# Patient Record
Sex: Female | Born: 1960 | Race: White | Hispanic: No | Smoking: Current every day smoker
Health system: Southern US, Community
[De-identification: ages and names within clinical notes are randomized; demographics above are authoritative.]

## PROBLEM LIST (undated history)

## (undated) DIAGNOSIS — F329 Major depressive disorder, single episode, unspecified: Secondary | ICD-10-CM

## (undated) DIAGNOSIS — F319 Bipolar disorder, unspecified: Secondary | ICD-10-CM

## (undated) DIAGNOSIS — G4700 Insomnia, unspecified: Secondary | ICD-10-CM

## (undated) DIAGNOSIS — F32A Depression, unspecified: Secondary | ICD-10-CM

## (undated) HISTORY — PX: EYE SURGERY: SHX253

---

## 1999-11-06 ENCOUNTER — Encounter: Payer: Self-pay | Admitting: Emergency Medicine

## 1999-11-06 ENCOUNTER — Emergency Department (HOSPITAL_COMMUNITY): Admission: EM | Admit: 1999-11-06 | Discharge: 1999-11-06 | Payer: Self-pay | Admitting: Emergency Medicine

## 2001-06-29 ENCOUNTER — Emergency Department (HOSPITAL_COMMUNITY): Admission: EM | Admit: 2001-06-29 | Discharge: 2001-06-29 | Payer: Self-pay

## 2002-05-05 ENCOUNTER — Emergency Department (HOSPITAL_COMMUNITY): Admission: EM | Admit: 2002-05-05 | Discharge: 2002-05-05 | Payer: Self-pay | Admitting: Emergency Medicine

## 2005-07-27 ENCOUNTER — Emergency Department (HOSPITAL_COMMUNITY): Admission: EM | Admit: 2005-07-27 | Discharge: 2005-07-27 | Payer: Self-pay | Admitting: Family Medicine

## 2009-08-08 ENCOUNTER — Emergency Department (HOSPITAL_COMMUNITY): Admission: EM | Admit: 2009-08-08 | Discharge: 2009-08-08 | Payer: Self-pay | Admitting: Family Medicine

## 2013-12-26 ENCOUNTER — Ambulatory Visit: Payer: No Typology Code available for payment source | Attending: Internal Medicine

## 2014-01-10 ENCOUNTER — Encounter (HOSPITAL_COMMUNITY): Payer: Self-pay | Admitting: Emergency Medicine

## 2014-01-10 ENCOUNTER — Emergency Department (HOSPITAL_COMMUNITY)
Admission: EM | Admit: 2014-01-10 | Discharge: 2014-01-10 | Disposition: A | Discharge status: Home / Self Care | Payer: No Typology Code available for payment source | Attending: Emergency Medicine | Admitting: Emergency Medicine

## 2014-01-10 DIAGNOSIS — F17200 Nicotine dependence, unspecified, uncomplicated: Secondary | ICD-10-CM | POA: Insufficient documentation

## 2014-01-10 DIAGNOSIS — R52 Pain, unspecified: Secondary | ICD-10-CM | POA: Insufficient documentation

## 2014-01-10 DIAGNOSIS — F411 Generalized anxiety disorder: Secondary | ICD-10-CM | POA: Insufficient documentation

## 2014-01-10 DIAGNOSIS — R519 Headache, unspecified: Secondary | ICD-10-CM

## 2014-01-10 DIAGNOSIS — Z79899 Other long term (current) drug therapy: Secondary | ICD-10-CM | POA: Insufficient documentation

## 2014-01-10 DIAGNOSIS — R51 Headache: Secondary | ICD-10-CM | POA: Insufficient documentation

## 2014-01-10 LAB — I-STAT CHEM 8, ED
BUN: 17 mg/dL (ref 6–23)
Calcium, Ion: 1.2 mmol/L (ref 1.12–1.23)
Chloride: 105 mEq/L (ref 96–112)
Creatinine, Ser: 0.7 mg/dL (ref 0.50–1.10)
Glucose, Bld: 89 mg/dL (ref 70–99)
HCT: 46 % (ref 36.0–46.0)
Hemoglobin: 15.6 g/dL — ABNORMAL HIGH (ref 12.0–15.0)
Potassium: 4.8 mEq/L (ref 3.7–5.3)
Sodium: 140 mEq/L (ref 137–147)
TCO2: 26 mmol/L (ref 0–100)

## 2014-01-10 LAB — I-STAT CG4 LACTIC ACID, ED: Lactic Acid, Venous: 1.03 mmol/L (ref 0.5–2.2)

## 2014-01-10 MED ORDER — KETOROLAC TROMETHAMINE 30 MG/ML IJ SOLN
30.0000 mg | Freq: Once | INTRAMUSCULAR | Status: AC
  Administered 2014-01-10: 30 mg via INTRAVENOUS
  Filled 2014-01-10: qty 1

## 2014-01-10 MED ORDER — HALOPERIDOL LACTATE 5 MG/ML IJ SOLN
2.0000 mg | Freq: Once | INTRAMUSCULAR | Status: AC
  Administered 2014-01-10: 2 mg via INTRAVENOUS
  Filled 2014-01-10: qty 1

## 2014-01-10 MED ORDER — SODIUM CHLORIDE 0.9 % IV BOLUS (SEPSIS)
1000.0000 mL | Freq: Once | INTRAVENOUS | Status: AC
  Administered 2014-01-10: 1000 mL via INTRAVENOUS

## 2014-01-10 MED ORDER — DIPHENHYDRAMINE HCL 50 MG/ML IJ SOLN
25.0000 mg | Freq: Once | INTRAMUSCULAR | Status: AC
  Administered 2014-01-10: 25 mg via INTRAVENOUS
  Filled 2014-01-10: qty 1

## 2014-01-10 NOTE — ED Notes (Signed)
Discontinued pts iv. Pt is getting dressed waiting at bedside for discharge paperwork from nurse.

## 2014-01-10 NOTE — ED Notes (Addendum)
Per pt sts she has had a severe HA for 3 days. sts aching all over and vision problems and vomiting up scant amount of blood. sts she is under a lot of stress.

## 2014-01-10 NOTE — ED Notes (Signed)
Pt on monitor, Pt monitored by pulse ox, blood pressure and 5 lead.

## 2014-01-10 NOTE — ED Provider Notes (Signed)
CSN: [id number]     Arrival date & time 01/10/14  1652 History   First MD Initiated Contact with Patient 01/10/14 1751     Chief Complaint  Patient presents with  . Migraine  . Generalized Body Aches     (Consider location/radiation/quality/duration/timing/severity/associated sxs/prior Treatment) Patient is a 53 y.o. female presenting with headaches. The history is provided by the patient.  Headache Pain location:  Generalized Quality:  Dull Radiates to:  Does not radiate Onset quality:  Gradual Duration:  3 days Timing:  Constant Progression:  Worsening Chronicity:  New Similar to prior headaches: yes   Context: activity, bright light, loud noise and straining   Relieved by:  Nothing Worsened by:  Nothing tried Ineffective treatments:  None tried Associated symptoms: myalgias   Associated symptoms: no abdominal pain, no cough, no fever and no weakness   Associated symptoms comment:  Body aches all over for the last several months.   History reviewed. No pertinent past medical history. Past Surgical History  Procedure Laterality Date  . Eye surgery     History reviewed. No pertinent family history. History  Substance Use Topics  . Smoking status: Current Every Day Smoker  . Smokeless tobacco: Not on file  . Alcohol Use: No   OB History   Grav Para Term Preterm Abortions TAB SAB Ect Mult Living                 Review of Systems  Constitutional: Negative for fever.  Respiratory: Negative for cough.   Gastrointestinal: Negative for abdominal pain.  Musculoskeletal: Positive for myalgias.  Neurological: Positive for headaches.  All other systems reviewed and are negative.     Allergies  Review of patient's allergies indicates no known allergies.  Home Medications   Prior to Admission medications   Medication Sig Start Date End Date Taking? Authorizing Provider  ALPRAZolam Prudy Feeler) 0.5 MG tablet Take 0.5 mg by mouth daily as needed for anxiety.   Yes  Historical Provider, MD  esomeprazole (NEXIUM) 40 MG capsule Take 40 mg by mouth daily at 12 noon.   Yes Historical Provider, MD   BP 120/63  Pulse 67  Temp(Src) 97.5 F (36.4 C) (Oral)  Resp 11  SpO2 99% Physical Exam  Constitutional: She is oriented to person, place, and time. She appears well-developed and well-nourished. No distress.  HENT:  Head: Normocephalic.  Eyes: Conjunctivae are normal.  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 No tracheal deviation present.  Cardiovascular: Normal rate, regular rhythm and normal heart sounds.   Pulmonary/Chest: Effort normal. No respiratory distress. She has no wheezes.  Abdominal: Soft. She exhibits no distension.  Neurological: She is alert and oriented to person, place, and time. No cranial nerve deficit. Coordination normal. GCS eye subscore is 4. GCS verbal subscore is 5. GCS motor subscore is 6.  Skin: Skin is warm and dry.  Psychiatric: Her mood appears anxious. Her speech is rapid and/or pressured.    ED Course  Procedures (including critical care time) Labs Review Labs Reviewed  I-STAT CHEM 8, ED - Abnormal; Notable for the following:    Hemoglobin 15.6 (*)    All other components within normal limits  I-STAT CG4 LACTIC ACID, ED    Imaging Review No results found.   EKG Interpretation None      MDM   Final diagnoses:  Headache  Body aches    53 y.o. female presents with headache over the  last 3 days and body aches that she says have been affecting her regarding her body over the last several weeks and she has been seeking disability for. She is still working on getting in to her primary care clinic and has been him and unable to see him. She has no focal neurologic findings on her exam and she speaking in full sentences but extremely anxious. She was given a modified migraine cocktail with Haldol, Benadryl, IV fluid, and Toradol with good relief of symptoms. She was  ambulatory without assistance of the emergency department and had no further headache symptoms. No indication for acute imaging, screening labs were normal, patient was recommended to followup with the community health on the center as previously recommended and she states she will do this.    Lyndal Pulleyaniel Briante Loveall, MD 01/10/14 [phone]

## 2014-01-10 NOTE — Discharge Instructions (Signed)

## 2014-01-10 NOTE — ED Provider Notes (Signed)
I saw and evaluated the patient, reviewed the resident's note and I agree with the findings and plan.   EKG Interpretation None     Pt here with migraine like HA without red-flags for [facility] or meningitis, neuro exam non-focal, headache cocktail given and she feels better  Toy Baker, MD 01/10/14 1927

## 2014-01-10 NOTE — ED Notes (Addendum)
Pt. Reports migraine x3 days, generalized muscle and joint pain. Also states she is sick to stomach and vomited up a small amount of blood x1. Reports right lower quadrant pain. Denies any medical hx.

## 2014-01-10 NOTE — ED Notes (Signed)
NOTIFIED DR. A. ALLEN FOR PATIENTS LAB RESULTS FOR CG4+ LACTIC ACID ,@19 :10 PM ,01/10/2014.

## 2014-01-11 NOTE — ED Provider Notes (Signed)
I saw and evaluated the patient, reviewed the resident's note and I agree with the findings and plan.   EKG Interpretation None       Toy Baker, MD 01/11/14 [phone]

## 2014-01-12 ENCOUNTER — Telehealth (HOSPITAL_COMMUNITY): Payer: Self-pay | Admitting: *Deleted

## 2014-01-12 NOTE — Telephone Encounter (Signed)
Telephoned patient at home # and left message to return call.  

## 2014-01-23 ENCOUNTER — Telehealth: Payer: Self-pay

## 2014-01-23 NOTE — Telephone Encounter (Signed)
Pt would like to set up an Est. Care Visit. Pt has recieved her [facility] card already.

## 2014-02-08 ENCOUNTER — Ambulatory Visit (INDEPENDENT_AMBULATORY_CARE_PROVIDER_SITE_OTHER): Payer: No Typology Code available for payment source | Admitting: Family Medicine

## 2014-02-08 ENCOUNTER — Encounter: Payer: Self-pay | Admitting: Family Medicine

## 2014-02-08 VITALS — BP 125/61 | HR 77 | Temp 98.0°F | Resp 20 | Ht 60.0 in | Wt 131.0 lb

## 2014-02-08 DIAGNOSIS — H6120 Impacted cerumen, unspecified ear: Secondary | ICD-10-CM

## 2014-02-08 DIAGNOSIS — M1990 Unspecified osteoarthritis, unspecified site: Secondary | ICD-10-CM

## 2014-02-08 DIAGNOSIS — F32A Depression, unspecified: Secondary | ICD-10-CM

## 2014-02-08 DIAGNOSIS — Z23 Encounter for immunization: Secondary | ICD-10-CM

## 2014-02-08 DIAGNOSIS — K219 Gastro-esophageal reflux disease without esophagitis: Secondary | ICD-10-CM

## 2014-02-08 DIAGNOSIS — R5381 Other malaise: Secondary | ICD-10-CM

## 2014-02-08 DIAGNOSIS — R5383 Other fatigue: Secondary | ICD-10-CM

## 2014-02-08 DIAGNOSIS — F17200 Nicotine dependence, unspecified, uncomplicated: Secondary | ICD-10-CM

## 2014-02-08 DIAGNOSIS — F329 Major depressive disorder, single episode, unspecified: Secondary | ICD-10-CM

## 2014-02-08 DIAGNOSIS — G4700 Insomnia, unspecified: Secondary | ICD-10-CM

## 2014-02-08 DIAGNOSIS — Z1211 Encounter for screening for malignant neoplasm of colon: Secondary | ICD-10-CM

## 2014-02-08 DIAGNOSIS — Z131 Encounter for screening for diabetes mellitus: Secondary | ICD-10-CM

## 2014-02-08 DIAGNOSIS — F3289 Other specified depressive episodes: Secondary | ICD-10-CM

## 2014-02-08 DIAGNOSIS — F411 Generalized anxiety disorder: Secondary | ICD-10-CM

## 2014-02-08 DIAGNOSIS — F1911 Other psychoactive substance abuse, in remission: Secondary | ICD-10-CM

## 2014-02-08 DIAGNOSIS — Z1231 Encounter for screening mammogram for malignant neoplasm of breast: Secondary | ICD-10-CM

## 2014-02-08 DIAGNOSIS — H6123 Impacted cerumen, bilateral: Secondary | ICD-10-CM

## 2014-02-08 DIAGNOSIS — M129 Arthropathy, unspecified: Secondary | ICD-10-CM

## 2014-02-08 MED ORDER — OMEPRAZOLE 40 MG PO CPDR: 40.0000 mg | DELAYED_RELEASE_CAPSULE | Freq: Every day | ORAL | Status: DC

## 2014-02-08 MED ORDER — CELECOXIB 100 MG PO CAPS
100.0000 mg | ORAL_CAPSULE | Freq: Two times a day (BID) | ORAL | Status: DC
Start: 2014-02-08 — End: 2014-02-28

## 2014-02-08 MED ORDER — BUPROPION HCL ER (XL) 150 MG PO TB24: 150.0000 mg | ORAL_TABLET | Freq: Every day | ORAL | Status: DC
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
## 2014-02-08 NOTE — Patient Instructions (Signed)
Recommend that patient take OTC Melatonin for insomnia. Start a sleep regimen. Refrain from reading, talking on the phone or watching television in bed.   GERD recommendations: Elevate the head of bed, Eat smaller meals throughout the course of the day, increase water intake, do not lay down within an hour of eating. Refrain from eating spicy foods, limit caffeine, chocolate, mint, tomato based foods etc.

## 2014-02-09 ENCOUNTER — Telehealth: Payer: Self-pay

## 2014-02-09 DIAGNOSIS — H6120 Impacted cerumen, unspecified ear: Secondary | ICD-10-CM | POA: Insufficient documentation

## 2014-02-09 DIAGNOSIS — R5381 Other malaise: Secondary | ICD-10-CM | POA: Insufficient documentation

## 2014-02-09 DIAGNOSIS — Z131 Encounter for screening for diabetes mellitus: Secondary | ICD-10-CM | POA: Insufficient documentation

## 2014-02-09 DIAGNOSIS — R5383 Other fatigue: Secondary | ICD-10-CM

## 2014-02-09 DIAGNOSIS — G4700 Insomnia, unspecified: Secondary | ICD-10-CM | POA: Insufficient documentation

## 2014-02-09 DIAGNOSIS — Z1211 Encounter for screening for malignant neoplasm of colon: Secondary | ICD-10-CM | POA: Insufficient documentation

## 2014-02-09 DIAGNOSIS — Z1231 Encounter for screening mammogram for malignant neoplasm of breast: Secondary | ICD-10-CM | POA: Insufficient documentation

## 2014-02-09 NOTE — Telephone Encounter (Signed)
Pt 's App for her Mammo (free/or reduced price) was faxed over today to [facility]. Pt will recieve call from clinic on date and time when paperwork has been reviewed.

## 2014-02-09 NOTE — Telephone Encounter (Signed)
Pt was contacted from office today concerning Smoking Cessation classes. Pt was informed of this being an 8 Wk class as well as given Ph# to contact .Also Pt was informed of Dental Referral thas has been placed as well.

## 2014-02-13 ENCOUNTER — Ambulatory Visit: Payer: Self-pay | Attending: Internal Medicine

## 2014-02-28 ENCOUNTER — Other Ambulatory Visit: Payer: Self-pay | Admitting: Family Medicine

## 2014-02-28 ENCOUNTER — Encounter: Payer: Self-pay | Admitting: Family Medicine

## 2014-02-28 ENCOUNTER — Ambulatory Visit: Payer: Self-pay | Admitting: Family Medicine

## 2014-02-28 ENCOUNTER — Ambulatory Visit (INDEPENDENT_AMBULATORY_CARE_PROVIDER_SITE_OTHER): Payer: No Typology Code available for payment source | Admitting: Family Medicine

## 2014-02-28 VITALS — BP 140/72 | HR 93 | Temp 98.0°F | Resp 18 | Ht 60.25 in | Wt 131.0 lb

## 2014-02-28 DIAGNOSIS — G4700 Insomnia, unspecified: Secondary | ICD-10-CM

## 2014-02-28 DIAGNOSIS — R5383 Other fatigue: Secondary | ICD-10-CM

## 2014-02-28 DIAGNOSIS — M1990 Unspecified osteoarthritis, unspecified site: Secondary | ICD-10-CM

## 2014-02-28 DIAGNOSIS — F32A Depression, unspecified: Secondary | ICD-10-CM

## 2014-02-28 DIAGNOSIS — R85611 Atypical squamous cells cannot exclude high grade squamous intraepithelial lesion on cytologic smear of anus (ASC-H): Secondary | ICD-10-CM

## 2014-02-28 DIAGNOSIS — F17200 Nicotine dependence, unspecified, uncomplicated: Secondary | ICD-10-CM

## 2014-02-28 DIAGNOSIS — F329 Major depressive disorder, single episode, unspecified: Secondary | ICD-10-CM

## 2014-02-28 DIAGNOSIS — F411 Generalized anxiety disorder: Secondary | ICD-10-CM

## 2014-02-28 DIAGNOSIS — M129 Arthropathy, unspecified: Secondary | ICD-10-CM

## 2014-02-28 DIAGNOSIS — Z01419 Encounter for gynecological examination (general) (routine) without abnormal findings: Secondary | ICD-10-CM

## 2014-02-28 DIAGNOSIS — F1911 Other psychoactive substance abuse, in remission: Secondary | ICD-10-CM

## 2014-02-28 DIAGNOSIS — K219 Gastro-esophageal reflux disease without esophagitis: Secondary | ICD-10-CM

## 2014-02-28 DIAGNOSIS — Z131 Encounter for screening for diabetes mellitus: Secondary | ICD-10-CM

## 2014-02-28 DIAGNOSIS — R5381 Other malaise: Secondary | ICD-10-CM

## 2014-02-28 DIAGNOSIS — F3289 Other specified depressive episodes: Secondary | ICD-10-CM

## 2014-02-28 DIAGNOSIS — Z0000 Encounter for general adult medical examination without abnormal findings: Secondary | ICD-10-CM

## 2014-02-28 LAB — COMPLETE METABOLIC PANEL WITH GFR
ALK PHOS: 77 U/L (ref 39–117)
ALT: 18 U/L (ref 0–35)
AST: 19 U/L (ref 0–37)
Albumin: 4.7 g/dL (ref 3.5–5.2)
BILIRUBIN TOTAL: 0.4 mg/dL (ref 0.2–1.2)
BUN: 12 mg/dL (ref 6–23)
CO2: 26 mEq/L (ref 19–32)
Calcium: 9.8 mg/dL (ref 8.4–10.5)
Chloride: 108 mEq/L (ref 96–112)
Creat: 0.66 mg/dL (ref 0.50–1.10)
GFR, Est African American: >89 mL/min
Glucose, Bld: 90 mg/dL (ref 70–99)
Potassium: 4.4 mEq/L (ref 3.5–5.3)
SODIUM: 140 meq/L (ref 135–145)
TOTAL PROTEIN: 7.2 g/dL (ref 6.0–8.3)

## 2014-02-28 LAB — LIPID PANEL
CHOL/HDL RATIO: 3 ratio
Cholesterol: 191 mg/dL (ref 0–200)
HDL: 63 mg/dL (ref >39)
LDL Cholesterol: 94 mg/dL (ref 0–99)
Triglycerides: 171 mg/dL — ABNORMAL HIGH (ref <150)
VLDL: 34 mg/dL (ref 0–40)

## 2014-02-28 MED ORDER — DICLOFENAC SODIUM 75 MG PO TBEC: 75.0000 mg | DELAYED_RELEASE_TABLET | Freq: Two times a day (BID) | ORAL | Status: DC

## 2014-02-28 MED ORDER — BUPROPION HCL ER (XL) 150 MG PO TB24: 150.0000 mg | ORAL_TABLET | Freq: Every day | ORAL | Status: DC
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
## 2014-03-01 LAB — CBC WITH DIFFERENTIAL/PLATELET
Basophils Absolute: 0.1 10*3/uL (ref 0.0–0.1)
Basophils Relative: 1 % (ref 0–1)
Eosinophils Absolute: 0.3 10*3/uL (ref 0.0–0.7)
Eosinophils Relative: 3 % (ref 0–5)
HEMATOCRIT: 43.7 % (ref 36.0–46.0)
HEMOGLOBIN: 15.3 g/dL — AB (ref 12.0–15.0)
LYMPHS ABS: 2.2 10*3/uL (ref 0.7–4.0)
Lymphocytes Relative: 26 % (ref 12–46)
MCH: 30.8 pg (ref 26.0–34.0)
MCHC: 35 g/dL (ref 30.0–36.0)
MCV: 87.9 fL (ref 78.0–100.0)
MONO ABS: 0.8 10*3/uL (ref 0.1–1.0)
MONOS PCT: 10 % (ref 3–12)
NEUTROS ABS: 5 10*3/uL (ref 1.7–7.7)
Neutrophils Relative %: 60 % (ref 43–77)
Platelets: 245 10*3/uL (ref 150–400)
RBC: 4.97 MIL/uL (ref 3.87–5.11)
RDW: 13.4 % (ref 11.5–15.5)
WBC: 8.4 10*3/uL (ref 4.0–10.5)

## 2014-03-01 LAB — TSH: TSH: 1.381 u[IU]/mL (ref 0.350–4.500)

## 2014-03-02 LAB — PAP IG AND HPV HIGH-RISK: HPV DNA HIGH RISK: NOT DETECTED

## 2014-06-08 ENCOUNTER — Ambulatory Visit: Payer: No Typology Code available for payment source | Admitting: Internal Medicine

## 2016-03-02 ENCOUNTER — Emergency Department (HOSPITAL_COMMUNITY)
Admission: EM | Admit: 2016-03-02 | Discharge: 2016-03-02 | Disposition: A | Discharge status: Home / Self Care | Payer: No Typology Code available for payment source | Attending: Emergency Medicine | Admitting: Emergency Medicine

## 2016-03-02 ENCOUNTER — Encounter (HOSPITAL_COMMUNITY): Payer: Self-pay | Admitting: Emergency Medicine

## 2016-03-02 DIAGNOSIS — F17200 Nicotine dependence, unspecified, uncomplicated: Secondary | ICD-10-CM | POA: Insufficient documentation

## 2016-03-02 DIAGNOSIS — K047 Periapical abscess without sinus: Secondary | ICD-10-CM | POA: Insufficient documentation

## 2016-03-02 MED ORDER — PENICILLIN V POTASSIUM 500 MG PO TABS: 500.0000 mg | ORAL_TABLET | Freq: Four times a day (QID) | ORAL | 0 refills | Status: AC

## 2016-03-02 MED ORDER — IBUPROFEN 800 MG PO TABS: 800.0000 mg | ORAL_TABLET | Freq: Three times a day (TID) | ORAL | 0 refills | Status: DC | PRN

## 2016-03-02 NOTE — ED Triage Notes (Signed)
Patient here with left upper dental pain and swelling, facial swelling with same x 2 days

## 2016-03-02 NOTE — Discharge Instructions (Signed)
Read the information below.  Use the prescribed medication as directed.  Please discuss all new medications with your pharmacist.  You may return to the Emergency Department at any time for worsening condition or any new symptoms that concern you.    Please call the dentist for close follow up appointment.  If you develop fevers, swelling in your face, difficulty swallowing or breathing, return to the ER immediately for a recheck.

## 2016-03-02 NOTE — ED Notes (Signed)
C/o left upper dental pain x several days, worse past 2 days. Swelling noted to left face.

## 2016-03-02 NOTE — ED Provider Notes (Signed)
[facility]-EMERGENCY DEPT Provider Note   First Provider Contact:  11:45 AM   By signing my name below, I, Earmon Phoenix, attest that this documentation has been prepared under the direction and in the presence of [facility], PA-C. Electronically Signed: Earmon Phoenix, ED Scribe. 03/02/16. 11:55 AM.   History   Chief Complaint Chief Complaint  Patient presents with  . Dental Pain   The history is provided by the patient and medical records. No language interpreter was used.    HPI Comments:  Meredith Lawrence is a 55 y.o. female who presents to the Emergency Department complaining of severe left upper dental pain that began yesterday morning. She reports associated left sided dental pain. She reports prolonged dental decay of multiple teeth. She has been taking BC Powder for pain. Eating increases the pain. She denies alleviating factors. She denies fever, chills, sore throat, difficulty breathing or swallowing, nausea, vomiting. Pt states she has a dentist that she is establishing care with soon.  History reviewed. No pertinent past medical history.  Patient Active Problem List   Diagnosis Date Noted  . Diabetes mellitus screening 02/09/2014  . Other malaise and fatigue 02/09/2014  . Other screening mammogram 02/09/2014  . Insomnia 02/09/2014  . Cerumen impaction 02/09/2014  . Encounter for screening colonoscopy 02/09/2014  . Depression 02/08/2014  . Arthritis 02/08/2014  . GERD without esophagitis 02/08/2014  . Anxiety state, unspecified 02/08/2014  . Need for Tdap vaccination 02/08/2014  . Tobacco dependence 02/08/2014  . 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 EYE SURGERY      OB History    No data available       Home Medications    Prior to Admission medications   Medication Sig Start Date End Date Taking? Authorizing Provider  buPROPion (WELLBUTRIN XL) 150 MG 24 hr tablet Take 1 tablet (150 mg  total) by mouth daily. 02/28/14   Massie Maroon, FNP  diclofenac (VOLTAREN) 75 MG EC tablet Take 1 tablet (75 mg total) by mouth 2 (two) times daily. 02/28/14   Massie Maroon, FNP  omeprazole (PRILOSEC) 40 MG capsule Take 1 capsule (40 mg total) by mouth daily. 02/08/14   Massie Maroon, FNP    Family History No family history on file.  Social History Social History  Substance Use Topics  . Smoking status: Current Every Day Smoker  . Smokeless tobacco: Never Used  . Alcohol use No     Allergies   Review of patient's allergies indicates no known allergies.   Review of Systems Review of Systems  Constitutional: Negative for chills and fever.  HENT: Positive for dental problem and facial swelling. Negative for sore throat and trouble swallowing.   Respiratory: Negative for shortness of breath.   Gastrointestinal: Negative for nausea and vomiting.  Hematological: Does not bruise/bleed easily.     Physical Exam Updated Vital Signs BP 154/89   Pulse 73   Temp 98 F (36.7 C) (Oral)   Resp 18   SpO2 100%   Physical Exam  Constitutional: She appears well-developed and well-nourished. No distress.  HENT:  Head: Normocephalic and atraumatic.  Mouth/Throat: Uvula is midline and oropharynx is clear and moist. Mucous membranes are not dry. No trismus in the jaw. Abnormal dentition. Dental abscesses and dental caries present. No uvula swelling. No oropharyngeal exudate, posterior oropharyngeal edema, posterior oropharyngeal erythema or tonsillar abscesses.  Left maxillary facial swelling and tenderness.  Eyes:  Prosthetic left eye  Neck: Trachea normal, normal range of motion and phonation normal. Neck supple. No tracheal tenderness present. No neck rigidity. No tracheal deviation, no edema, no erythema and normal range of motion present.  Cardiovascular: Normal rate.   Pulmonary/Chest: Effort normal and breath sounds normal. No stridor.  Lymphadenopathy:    She has no  cervical adenopathy.  Neurological: She is alert.  Skin: She is not diaphoretic.  Nursing note and vitals reviewed.    ED Treatments / Results  Labs (all labs ordered are listed, but only abnormal results are displayed) Labs Reviewed - No data to display  EKG  EKG Interpretation None       Radiology No results found.  Procedures Procedures (including critical care time)  Medications Ordered in ED Medications - No data to display   Initial Impression / Assessment and Plan / ED Course  I have reviewed the triage vital signs and the nursing notes.  Pertinent labs & imaging results that were available during my care of the patient were reviewed by me and considered in my medical decision making (see chart for details).  Clinical Course  DIAGNOSTIC STUDIES: Oxygen Saturation is 100% on RA, normal by my interpretation.   COORDINATION OF CARE: 11:52 AM- Will prescribe pain medication and antibiotic. Advised pt to follow up with dentist ASAP. Pt verbalizes understanding and agrees to plan.  Medications - No data to display   Afebrile, nontoxic patient with new dental pain and obvious abscess.  No airway concerns. No e/o Ludwig's angina.  D/C home with antibiotic, pain medication and dental follow up.  Pt declines any pain medication that might cause her to be altered, doesn't like how she feels on them.  Discussed findings, treatment, and follow up  with patient.  Pt given return precautions.  Pt verbalizes understanding and agrees with plan.       I personally performed the services described in this documentation, which was scribed in my presence. The recorded information has been reviewed and is accurate.   Final Clinical Impressions(s) / ED Diagnoses   Final diagnoses:  Dental abscess    New Prescriptions Discharge Medication List as of 03/02/2016 11:59 AM    START taking these medications   Details  ibuprofen (ADVIL,MOTRIN) 800 MG tablet Take 1 tablet (800 mg  total) by mouth every 8 (eight) hours as needed for mild pain or moderate pain., Starting Sun 03/02/2016, Print    penicillin v potassium (VEETID) 500 MG tablet Take 1 tablet (500 mg total) by mouth 4 (four) times daily., Starting Sun 03/02/2016, Until Sun 03/09/2016, Print         [place], PA-C 03/02/16 1214    Maia Plan, MD 03/02/16 [phone]

## 2016-03-03 ENCOUNTER — Emergency Department (HOSPITAL_COMMUNITY)
Admission: EM | Admit: 2016-03-03 | Discharge: 2016-03-03 | Disposition: A | Discharge status: Home / Self Care | Payer: No Typology Code available for payment source | Attending: Emergency Medicine | Admitting: Emergency Medicine

## 2016-03-03 ENCOUNTER — Encounter (HOSPITAL_COMMUNITY): Payer: Self-pay | Admitting: Emergency Medicine

## 2016-03-03 DIAGNOSIS — K047 Periapical abscess without sinus: Secondary | ICD-10-CM

## 2016-03-03 DIAGNOSIS — F17200 Nicotine dependence, unspecified, uncomplicated: Secondary | ICD-10-CM | POA: Insufficient documentation

## 2016-03-03 MED ORDER — FENTANYL CITRATE (PF) 100 MCG/2ML IJ SOLN
50.0000 ug | Freq: Once | INTRAMUSCULAR | Status: AC
  Administered 2016-03-03: 50 ug via INTRAVENOUS
  Filled 2016-03-03: qty 2

## 2016-03-03 MED ORDER — PENICILLIN V POTASSIUM 250 MG PO TABS
500.0000 mg | ORAL_TABLET | Freq: Once | ORAL | Status: AC
  Administered 2016-03-03: 500 mg via ORAL
  Filled 2016-03-03: qty 2

## 2016-03-03 MED ORDER — SODIUM CHLORIDE 0.9 % IV BOLUS (SEPSIS)
500.0000 mL | Freq: Once | INTRAVENOUS | Status: AC
  Administered 2016-03-03: 500 mL via INTRAVENOUS

## 2016-03-03 MED ORDER — LIDOCAINE-EPINEPHRINE 2 %-1:100000 IJ SOLN
1.7000 mL | Freq: Once | INTRAMUSCULAR | Status: DC
  Filled 2016-03-03: qty 1.7

## 2016-03-03 MED ORDER — HYDROCODONE-ACETAMINOPHEN 5-325 MG PO TABS: 1.0000 | ORAL_TABLET | ORAL | 0 refills | Status: DC | PRN

## 2016-03-03 NOTE — ED Triage Notes (Signed)
Patient reports worsening left upper molar pain with left facial swelling onset 3 days ago unrelieved by Ibuprofen , seen here yesterday prescribed with oral antibiotic .

## 2016-03-03 NOTE — Discharge Instructions (Signed)
Read the information below.  Use the prescribed medication as directed.  Please discuss all new medications with your pharmacist.  Do not take additional tylenol while taking the prescribed pain medication to avoid overdose.  You may return to the Emergency Department at any time for worsening condition or any new symptoms that concern you.   Please call your dentist within 48 hours to schedule a close follow up appointment.  If you develop fevers, swelling in your face, difficulty swallowing or breathing, return to the ER immediately for a recheck.   °

## 2016-03-03 NOTE — ED Notes (Signed)
Patient ambulates per baseline; no questions or concerns; VSS.

## 2016-03-03 NOTE — ED Notes (Signed)
Patient reports she was unable to get antibiotics filled yesterday from visit. Swelling in jaw, cheek, and eye. Pain 8/10

## 2016-03-03 NOTE — ED Provider Notes (Signed)
[facility]Y DEPT Provider Note   CSN: [id number] Arrival date & time: 03/03/16  5597  First Provider Contact:  None       History   Chief Complaint Chief Complaint  Patient presents with  . Dental Pain  . Facial Swelling    HPI Meredith Lawrence is a 55 y.o. female.  HPI   Pt seen in the ED yesterday by me with dental abscess returns today with worsening symptoms and increased pain.  States she was not able to fill her prescriptions yesterday.  Continues to deny fevers, sore throat, difficulty swallowing or breathing.  Has been taking ibuprofen at home with mild improvement.  Is drinking but not eating.  Feels she wants to see dentist in [place], [place], does not want to see a dentist in [place].    History reviewed. No pertinent past medical history.  Patient Active Problem List   Diagnosis Date Noted  . Diabetes mellitus screening 02/09/2014  . Other malaise and fatigue 02/09/2014  . Other screening mammogram 02/09/2014  . Insomnia 02/09/2014  . Cerumen impaction 02/09/2014  . Encounter for screening colonoscopy 02/09/2014  . Depression 02/08/2014  . Arthritis 02/08/2014  . GERD without esophagitis 02/08/2014  . Anxiety state, unspecified 02/08/2014  . Need for Tdap vaccination 02/08/2014  . Tobacco dependence 02/08/2014  . 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 EYE SURGERY      OB History    No data available       Home Medications    Prior to Admission medications   Medication Sig Start Date End Date Taking? Authorizing Provider  buPROPion (WELLBUTRIN XL) 150 MG 24 hr tablet Take 1 tablet (150 mg total) by mouth daily. 02/28/14   Massie Maroon, FNP  diclofenac (VOLTAREN) 75 MG EC tablet Take 1 tablet (75 mg total) by mouth 2 (two) times daily. 02/28/14   Massie Maroon, FNP  ibuprofen (ADVIL,MOTRIN) 800 MG tablet Take 1 tablet (800 mg total) by mouth every 8 (eight) hours as needed for  mild pain or moderate pain. 03/02/16   Trixie Dredge, PA-C  omeprazole (PRILOSEC) 40 MG capsule Take 1 capsule (40 mg total) by mouth daily. 02/08/14   Massie Maroon, FNP  penicillin v potassium (VEETID) 500 MG tablet Take 1 tablet (500 mg total) by mouth 4 (four) times daily. 03/02/16 03/09/16  Trixie Dredge, PA-C    Family History No family history on file.  Social History Social History  Substance Use Topics  . Smoking status: Current Every Day Smoker  . Smokeless tobacco: Never Used  . Alcohol use No     Allergies   Review of patient's allergies indicates no known allergies.   Review of Systems Review of Systems  Constitutional: Negative for chills and fever.  HENT: Positive for facial swelling. Negative for sore throat and trouble swallowing.   Respiratory: Negative for shortness of breath.   Cardiovascular: Negative for chest pain.  Musculoskeletal: Negative for myalgias, neck pain and neck stiffness.  Skin: Negative for color change and rash.  Allergic/Immunologic: Negative for immunocompromised state.  Psychiatric/Behavioral: Negative for self-injury.     Physical Exam Updated Vital Signs BP 135/90 (BP Location: Right Arm)   Pulse 92   Temp 98.2 F (36.8 C) (Oral)   Resp 16   Ht 5' (1.524 m)   Wt 59.4 kg   SpO2 99%   BMI 25.58 kg/m   Physical Exam  Constitutional: She appears well-developed and well-nourished. No distress.  HENT:  Head: Normocephalic and atraumatic.  Mouth/Throat: Oropharynx is clear and moist. Mucous membranes are not dry. Abnormal dentition. Dental abscesses present. No oropharyngeal exudate, posterior oropharyngeal edema or posterior oropharyngeal erythema.  Left upper dental abscess with associated facial swelling.    Eyes: Conjunctivae are normal.  Neck: Neck supple.  Cardiovascular: Normal rate and regular rhythm.   Pulmonary/Chest: Effort normal and breath sounds normal. No respiratory distress. She has no wheezes. She has no rales.    Neurological: She is alert.  Skin: She is not diaphoretic.  Nursing note and vitals reviewed.    ED Treatments / Results  Labs (all labs ordered are listed, but only abnormal results are displayed) Labs Reviewed - No data to display  EKG  EKG Interpretation None       Radiology No results found.  Procedures Procedures (including critical care time)  Medications Ordered in ED Medications - No data to display   Initial Impression / Assessment and Plan / ED Course  I have reviewed the triage vital signs and the nursing notes.  Pertinent labs & imaging results that were available during my care of the patient were reviewed by me and considered in my medical decision making (see chart for details).  Clinical Course   INCISION AND DRAINAGE Performed by: Trixie Dredge Consent: Verbal consent obtained. Risks and benefits: risks, benefits and alternatives were discussed Type: abscess  Body area: left upper gingiva  Anesthesia: dental block (regional nerve block)  Incision was made with a scalpel.  Local anesthetic: marcaine   Anesthetic total: 1.7 ml  Complexity: simple  Drainage: purulent  Drainage amount: moderate  Packing material: none  Small incision made in very obvious abscess of left upper gingiva with white purulent drainage.  Patient tolerance: Patient tolerated the procedure well with no immediate complications.     Afebrile, nontoxic patient with new dental pain and obvious abscess.  No airway concerns. No e/o Ludwig's angina.  Pt with obvious abscess with easy I&D in the department and no immediate dental follow up planned (pt feels she will be able to start with a new dentist in [place])-  I&D offered to provide relief.  Dental block and I&D with improvement of symptoms.  Pt states she will go directly to fill her antibiotic prescriptions that I gave her yesterday - has money and a ride today.  D/C home with same prescriptions from yesterday  antibiotic, pain medication and dental follow up.  Discussed findings, treatment, and follow up  with patient.  Pt given return precautions.  Pt verbalizes understanding and agrees with plan.       Final Clinical Impressions(s) / ED Diagnoses   Final diagnoses:  Dental abscess    New Prescriptions New Prescriptions   No medications on file     Trixie Dredge, PA-C 03/03/16 1258    Rolan Bucco, MD 03/03/16 1531

## 2016-03-03 NOTE — ED Notes (Signed)
PA at bedside to I and D abcess. Draining well. Pt tolerated well.

## 2016-05-22 ENCOUNTER — Emergency Department (HOSPITAL_COMMUNITY)
Admission: EM | Admit: 2016-05-22 | Discharge: 2016-05-22 | Disposition: A | Discharge status: Other Acute Inpatient Hospital | Payer: No Typology Code available for payment source | Attending: Emergency Medicine | Admitting: Emergency Medicine

## 2016-05-22 ENCOUNTER — Encounter (HOSPITAL_COMMUNITY): Payer: Self-pay | Admitting: General Practice

## 2016-05-22 ENCOUNTER — Inpatient Hospital Stay (HOSPITAL_COMMUNITY)
Admission: EM | Admit: 2016-05-22 | Discharge: 2016-05-23 | DRG: 885 | Disposition: A | Discharge status: Home / Self Care | Payer: Federal, State, Local not specified - Other | Source: Intra-hospital | Attending: Psychiatry | Admitting: Psychiatry

## 2016-05-22 ENCOUNTER — Encounter (HOSPITAL_COMMUNITY): Payer: Self-pay | Admitting: Emergency Medicine

## 2016-05-22 DIAGNOSIS — K0889 Other specified disorders of teeth and supporting structures: Secondary | ICD-10-CM | POA: Diagnosis present

## 2016-05-22 DIAGNOSIS — F1994 Other psychoactive substance use, unspecified with psychoactive substance-induced mood disorder: Secondary | ICD-10-CM | POA: Diagnosis present

## 2016-05-22 DIAGNOSIS — G4700 Insomnia, unspecified: Secondary | ICD-10-CM | POA: Diagnosis present

## 2016-05-22 DIAGNOSIS — K219 Gastro-esophageal reflux disease without esophagitis: Secondary | ICD-10-CM | POA: Diagnosis present

## 2016-05-22 DIAGNOSIS — F3162 Bipolar disorder, current episode mixed, moderate: Secondary | ICD-10-CM | POA: Diagnosis present

## 2016-05-22 DIAGNOSIS — F17210 Nicotine dependence, cigarettes, uncomplicated: Secondary | ICD-10-CM | POA: Insufficient documentation

## 2016-05-22 DIAGNOSIS — F319 Bipolar disorder, unspecified: Secondary | ICD-10-CM | POA: Insufficient documentation

## 2016-05-22 DIAGNOSIS — R45850 Homicidal ideations: Secondary | ICD-10-CM | POA: Diagnosis present

## 2016-05-22 DIAGNOSIS — Z23 Encounter for immunization: Secondary | ICD-10-CM | POA: Diagnosis not present

## 2016-05-22 DIAGNOSIS — Z79899 Other long term (current) drug therapy: Secondary | ICD-10-CM | POA: Diagnosis not present

## 2016-05-22 HISTORY — DX: Depression, unspecified: F32.A

## 2016-05-22 HISTORY — DX: Major depressive disorder, single episode, unspecified: F32.9

## 2016-05-22 LAB — RAPID URINE DRUG SCREEN, HOSP PERFORMED
AMPHETAMINES: NOT DETECTED
Barbiturates: NOT DETECTED
Benzodiazepines: NOT DETECTED
Cocaine: NOT DETECTED
OPIATES: NOT DETECTED
TETRAHYDROCANNABINOL: POSITIVE — AB

## 2016-05-22 LAB — COMPREHENSIVE METABOLIC PANEL
ALT: 15 U/L (ref 14–54)
AST: 17 U/L (ref 15–41)
Albumin: 4.6 g/dL (ref 3.5–5.0)
Alkaline Phosphatase: 87 U/L (ref 38–126)
Anion gap: 12 (ref 5–15)
BUN: 8 mg/dL (ref 6–20)
CHLORIDE: 107 mmol/L (ref 101–111)
CO2: 21 mmol/L — AB (ref 22–32)
CREATININE: 0.59 mg/dL (ref 0.44–1.00)
Calcium: 9.7 mg/dL (ref 8.9–10.3)
GFR calc Af Amer: >60 mL/min (ref >60)
GFR calc non Af Amer: >60 mL/min (ref >60)
GLUCOSE: 106 mg/dL — AB (ref 65–99)
Potassium: 3.8 mmol/L (ref 3.5–5.1)
SODIUM: 140 mmol/L (ref 135–145)
Total Bilirubin: 0.4 mg/dL (ref 0.3–1.2)
Total Protein: 7.3 g/dL (ref 6.5–8.1)

## 2016-05-22 LAB — CBC
HCT: 45.2 % (ref 36.0–46.0)
HEMOGLOBIN: 16 g/dL — AB (ref 12.0–15.0)
MCH: 31.2 pg (ref 26.0–34.0)
MCHC: 35.4 g/dL (ref 30.0–36.0)
MCV: 88.1 fL (ref 78.0–100.0)
Platelets: 238 10*3/uL (ref 150–400)
RBC: 5.13 MIL/uL — AB (ref 3.87–5.11)
RDW: 12.9 % (ref 11.5–15.5)
WBC: 11.8 10*3/uL — ABNORMAL HIGH (ref 4.0–10.5)

## 2016-05-22 LAB — URINALYSIS, ROUTINE W REFLEX MICROSCOPIC
BILIRUBIN URINE: NEGATIVE
Glucose, UA: NEGATIVE mg/dL
KETONES UR: NEGATIVE mg/dL
NITRITE: NEGATIVE
PH: 5.5 (ref 5.0–8.0)
Protein, ur: NEGATIVE mg/dL
Specific Gravity, Urine: 1.005 (ref 1.005–1.030)

## 2016-05-22 LAB — URINE MICROSCOPIC-ADD ON: WBC UA: NONE SEEN WBC/hpf (ref 0–5)

## 2016-05-22 LAB — ACETAMINOPHEN LEVEL: Acetaminophen (Tylenol), Serum: <10 ug/mL — ABNORMAL LOW (ref 10–30)

## 2016-05-22 LAB — ETHANOL: Alcohol, Ethyl (B): 128 mg/dL — ABNORMAL HIGH (ref <5)

## 2016-05-22 LAB — SALICYLATE LEVEL: Salicylate Lvl: <7 mg/dL (ref 2.8–30.0)

## 2016-05-22 MED ORDER — ACETAMINOPHEN 325 MG PO TABS: 650.0000 mg | ORAL_TABLET | Freq: Four times a day (QID) | ORAL | Status: DC | PRN

## 2016-05-22 MED ORDER — PANTOPRAZOLE SODIUM 40 MG PO TBEC
40.0000 mg | DELAYED_RELEASE_TABLET | Freq: Every day | ORAL | Status: DC
  Administered 2016-05-22: 40 mg via ORAL
  Filled 2016-05-22: qty 1

## 2016-05-22 MED ORDER — PNEUMOCOCCAL VAC POLYVALENT 25 MCG/0.5ML IJ INJ: 0.5000 mL | INJECTION | INTRAMUSCULAR | Status: DC

## 2016-05-22 MED ORDER — INFLUENZA VAC SPLIT QUAD 0.5 ML IM SUSY
0.5000 mL | PREFILLED_SYRINGE | INTRAMUSCULAR | Status: AC
  Administered 2016-05-23: 0.5 mL via INTRAMUSCULAR
  Filled 2016-05-22: qty 0.5

## 2016-05-22 MED ORDER — QUETIAPINE FUMARATE 50 MG PO TABS
50.0000 mg | ORAL_TABLET | Freq: Every day | ORAL | Status: DC
  Administered 2016-05-22: 50 mg via ORAL
  Filled 2016-05-22 (×4): qty 1

## 2016-05-22 MED ORDER — TRAZODONE HCL 50 MG PO TABS: 50.0000 mg | ORAL_TABLET | Freq: Every evening | ORAL | Status: DC | PRN

## 2016-05-22 MED ORDER — DICLOFENAC SODIUM 75 MG PO TBEC
75.0000 mg | DELAYED_RELEASE_TABLET | Freq: Two times a day (BID) | ORAL | Status: DC
  Administered 2016-05-22 – 2016-05-23 (×2): 75 mg via ORAL
  Filled 2016-05-22 (×8): qty 1

## 2016-05-22 MED ORDER — IBUPROFEN 800 MG PO TABS: 800.0000 mg | ORAL_TABLET | Freq: Three times a day (TID) | ORAL | Status: DC | PRN

## 2016-05-22 MED ORDER — BENZOCAINE 10 % MT GEL: Freq: Four times a day (QID) | OROMUCOSAL | Status: DC | PRN

## 2016-05-22 MED ORDER — MAGNESIUM HYDROXIDE 400 MG/5ML PO SUSP: 30.0000 mL | Freq: Every day | ORAL | Status: DC | PRN

## 2016-05-22 MED ORDER — ALUM & MAG HYDROXIDE-SIMETH 200-200-20 MG/5ML PO SUSP: 30.0000 mL | ORAL | Status: DC | PRN

## 2016-05-22 MED ORDER — BUPROPION HCL ER (XL) 150 MG PO TB24
150.0000 mg | ORAL_TABLET | Freq: Every day | ORAL | Status: DC
  Administered 2016-05-22 – 2016-05-23 (×2): 150 mg via ORAL
  Filled 2016-05-22 (×6): qty 1

## 2016-05-22 MED ORDER — PANTOPRAZOLE SODIUM 40 MG PO TBEC
40.0000 mg | DELAYED_RELEASE_TABLET | Freq: Every day | ORAL | Status: DC
  Administered 2016-05-23: 40 mg via ORAL
  Filled 2016-05-22 (×4): qty 1
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
## 2016-05-22 NOTE — [facility] Group Notes (Signed)
[facility]  05/22/2016 2:21 PM  Type of Therapy:  Group Therapy  Participation Level:  Did Not Attend   Sallee Langenne C Cunningham 05/22/2016, 2:21 PM
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
## 2016-05-22 NOTE — Progress Notes (Signed)
Medication change note  Patient complains of tooth pain.  Patient has poor dentition, there are no obvious inflamed or flocculent areas of it in her mouth at present.  Assessment/plan we'll prescribe Orajel when necessary for dental pain and monitor response. Patient may also use Tylenol or Motrin as desired.  Meredith SitElizabeth Woods Haidar Muse, MD

## 2016-05-22 NOTE — ED Notes (Signed)
TTS at bedside. 

## 2016-05-22 NOTE — B[facility] Suicide Risk Assessment (Signed)
[facility] INPATIENT:  Family/Significant Other Suicide Prevention Education  Suicide Prevention Education:  Patient Refusal for Family/Significant Other Suicide Prevention Education: The patient Meredith Lawrence has refused to provide written consent for family/significant other to be provided Family/Significant Other Suicide Prevention Education during admission and/or prior to discharge.  Physician notified.  Sallee Langenne C Jackie Littlejohn 05/22/2016, 1:14 PM

## 2016-05-22 NOTE — Tx Team (Signed)
Initial Treatment Plan 05/22/2016 4:35 PM Meredith Lawrence ZOX:[id number]    PATIENT STRESSORS: Financial difficulties Health problems Medication change or noncompliance Occupational concerns   PATIENT STRENGTHS: Wellsite geologistCommunication skills General fund of knowledge   PATIENT IDENTIFIED PROBLEMS: Anger    "I drink but I'm not an alcoholic"    Get medications     "I have no transportation"         DISCHARGE CRITERIA:  Improved stabilization in mood, thinking, and/or behavior Motivation to continue treatment in a less acute level of care Verbal commitment to aftercare and medication compliance  PRELIMINARY DISCHARGE PLAN: Attend PHP/IOP Outpatient therapy  PATIENT/FAMILY INVOLVEMENT: This treatment plan has been presented to and reviewed with the patient, Meredith Lawrence.  The patient and family have been given the opportunity to ask questions and make suggestions.  Larina Earthlyopson, Socorro Ebron E, RN 05/22/2016, 4:35 PM
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
## 2016-05-22 NOTE — [facility] Suicide Risk Assessment (Signed)
[facility]   Nursing information obtained from:   pt  Demographic factors:   caucasian Current Mental Status:   see MSE Loss Factors:   relationships Historical Factors:   substance use Risk Reduction Factors:   resiliance  Total Time spent with patient: 45 minutes Principal Problem: Moderate mixed bipolar I disorder (HCC) Diagnosis:   Patient Active Problem List   Diagnosis Date Noted  . Substance induced mood disorder (HCC) [F19.94] 05/22/2016  . Moderate mixed bipolar I disorder (HCC) [F31.62] 05/22/2016  . Diabetes mellitus screening [Z13.1] 02/09/2014  . Other malaise and fatigue [R53.81, R53.83] 02/09/2014  . Other screening mammogram [Z12.31] 02/09/2014  . Insomnia [G47.00] 02/09/2014  . Cerumen impaction [H61.20] 02/09/2014  . Encounter for screening colonoscopy [Z12.11] 02/09/2014  . Depression [F32.9] 02/08/2014  . Arthritis [M19.90] 02/08/2014  . GERD without esophagitis [K21.9] 02/08/2014  . Anxiety state, unspecified [F41.1] 02/08/2014  . Need for Tdap vaccination [Z23] 02/08/2014  . Tobacco dependence [F17.200] 02/08/2014  . History of drug abuse in remission [Z87.898] 02/08/2014   Subjective Data: Patient denies current suicidal or homicidal ideation, plan or intent.  Continued Clinical Symptoms:    The "Alcohol Use Disorders Identification Test", Guidelines for Use in Primary Care, Second Edition.  World Science writerHealth Organization [facility](WHO). Score between 0-7:  no or low risk or alcohol related problems. Score between 8-15:  moderate risk of alcohol related problems. Score between 16-19:  high risk of alcohol related problems. Score 20 or above:  warrants further diagnostic evaluation for alcohol dependence and treatment.   CLINICAL FACTORS:   Bipolar Disorder:   Bipolar II Mixed State   Musculoskeletal: Strength & Muscle Tone: within normal limits Gait & Station: normal Patient leans: N/A  Psychiatric Specialty Exam: Physical Exam   ROS  Blood pressure (!) 150/76, pulse 100, temperature 98.5 F (36.9 C), temperature source Oral, resp. rate 18, height 5' 0.5" (1.537 m), weight 57.6 kg (127 lb), SpO2 98 %.Body mass index is 24.39 kg/m.  General Appearance: Disheveled  Eye Contact:  Fair  Speech:  Pressured  Volume:  Normal  Mood:  Anxious, Euphoric and Irritable  Affect:  Broad and mildly elevated  Thought Process:  Goal Directed  Orientation:  Negative  Thought Content:  Negative  Suicidal Thoughts:  No  Homicidal Thoughts:  No  Memory:  Negative  Judgement:  Fair  Insight:  Fair  Psychomotor Activity:  Restlessness  Concentration:  Concentration: Fair and Attention Span: Fair  Recall:  Good  Fund of Knowledge:  Good  Language:  Good  Akathisia:  No  Handed:  Right  AIMS (if indicated):   0  Assets:  Desire for Improvement Resilience  ADL's:  Intact  Cognition:  WNL  Sleep:         COGNITIVE FEATURES THAT CONTRIBUTE TO RISK:  None    SUICIDE RISK: mild in controlled environment today, moderate due to impulsivity, possible bipolar/boderline symptoms, chronic pain, losses in OP environment.   PLAN OF CARE: see PAA  I certify that inpatient services furnished can reasonably be expected to improve the patient's condition.  Acquanetta SitElizabeth Woods Oates, MD 05/22/2016, 2:09 PM
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
## 2016-05-22 NOTE — ED Provider Notes (Signed)
[facility]-EMERGENCY DEPT Provider Note   CSN: [id number] Arrival date & time: 05/22/16  0046     History   Chief Complaint Chief Complaint  Patient presents with  . Homicidal    HPI Meredith Lawrence is a 55 y.o. female.  Patient presents to the emergency department with chief complaint of homicidal ideation. She states that everything in her life is combining against her. She states that she feels violent toward "everyone." She states that she feels especially homicidal because of the person that is living with her. She states that she has been dealing with "issues" for many years, and it is also starting to get to her. She reports using drugs and alcohol as often as possible to help her cope with her situation. She denies having a clear plan of how she would hurt herself or anyone else. She denies any other symptoms at this time.   The history is provided by the patient. No language interpreter was used.    Past Medical History:  Diagnosis Date  . Depression     Patient Active Problem List   Diagnosis Date Noted  . Diabetes mellitus screening 02/09/2014  . Other malaise and fatigue 02/09/2014  . Other screening mammogram 02/09/2014  . Insomnia 02/09/2014  . Cerumen impaction 02/09/2014  . Encounter for screening colonoscopy 02/09/2014  . Depression 02/08/2014  . Arthritis 02/08/2014  . GERD without esophagitis 02/08/2014  . Anxiety state, unspecified 02/08/2014  . Need for Tdap vaccination 02/08/2014  . Tobacco dependence 02/08/2014  . 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 EYE SURGERY      OB History    No data available       Home Medications    Prior to Admission medications   Medication Sig Start Date End Date Taking? Authorizing Provider  buPROPion (WELLBUTRIN XL) 150 MG 24 hr tablet Take 1 tablet (150 mg total) by mouth daily. 02/28/14   Massie Maroon, FNP  diclofenac (VOLTAREN) 75 MG EC  tablet Take 1 tablet (75 mg total) by mouth 2 (two) times daily. 02/28/14   Massie Maroon, FNP  HYDROcodone-acetaminophen (NORCO/VICODIN) 5-325 MG tablet Take 1 tablet by mouth every 4 (four) hours as needed for severe pain. 03/03/16   Trixie Dredge, PA-C  ibuprofen (ADVIL,MOTRIN) 800 MG tablet Take 1 tablet (800 mg total) by mouth every 8 (eight) hours as needed for mild pain or moderate pain. 03/02/16   Trixie Dredge, PA-C  omeprazole (PRILOSEC) 40 MG capsule Take 1 capsule (40 mg total) by mouth daily. 02/08/14   Massie Maroon, FNP    Family History No family history on file.  Social History Social History  Substance Use Topics  . Smoking status: Current Every Day Smoker    Types: Cigarettes  . Smokeless tobacco: Never Used  . Alcohol use Yes     Allergies   Review of patient's allergies indicates no known allergies.   Review of Systems Review of Systems  All other systems reviewed and are negative.    Physical Exam Updated Vital Signs BP 128/81 (BP Location: Left Arm)   Pulse 101   Temp 98.2 F (36.8 C) (Oral)   Resp 18   Ht 5' 0.5" (1.537 m)   Wt 57.6 kg   SpO2 96%   BMI 24.41 kg/m   Physical Exam Physical Exam  Constitutional: Pt appears well-developed and well-nourished. No distress.  Awake, alert, nontoxic appearance  HENT:  Head: Normocephalic and atraumatic.  Mouth/Throat: Oropharynx is clear and moist. No oropharyngeal exudate.  Eyes: Conjunctivae are normal. No scleral icterus.  Neck: Normal range of motion. Neck supple.  Cardiovascular: Normal rate, regular rhythm and intact distal pulses.   Pulmonary/Chest: Effort normal and breath sounds normal. No respiratory distress. Pt has no wheezes.  Equal chest expansion  Abdominal: Soft. Bowel sounds are normal. Pt exhibits no mass. There is no tenderness. There is no rebound and no guarding.  Musculoskeletal: Normal range of motion. Pt exhibits no edema.  Neurological: Pt is alert.  Speech is clear and goal  oriented Moves extremities without ataxia  Skin: Skin is warm and dry. Pt is not diaphoretic.  Psychiatric: Pt has a normal mood and affect.  Nursing note and vitals reviewed.    ED Treatments / Results  Labs (all labs ordered are listed, but only abnormal results are displayed) Labs Reviewed  COMPREHENSIVE METABOLIC PANEL - Abnormal; Notable for the following:       Result Value   CO2 21 (*)    Glucose, Bld 106 (*)    All other components within normal limits  ETHANOL - Abnormal; Notable for the following:    Alcohol, Ethyl (B) 128 (*)    All other components within normal limits  ACETAMINOPHEN LEVEL - Abnormal; Notable for the following:    Acetaminophen (Tylenol), Serum <10 (*)    All other components within normal limits  CBC - Abnormal; Notable for the following:    WBC 11.8 (*)    RBC 5.13 (*)    Hemoglobin 16.0 (*)    All other components within normal limits  RAPID URINE DRUG SCREEN, HOSP PERFORMED - Abnormal; Notable for the following:    Tetrahydrocannabinol POSITIVE (*)    All other components within normal limits  SALICYLATE LEVEL    EKG  EKG Interpretation None       Radiology No results found.  Procedures Procedures (including critical care time)  Medications Ordered in ED Medications - No data to display   Initial Impression / Assessment and Plan / ED Course  I have reviewed the triage vital signs and the nursing notes.  Pertinent labs & imaging results that were available during my care of the patient were reviewed by me and considered in my medical decision making (see chart for details).  Clinical Course    Patient with SI and HI. No clear plan. She does express having violent thoughts and feelings towards people in her neighborhood.  Laboratory workup is reassuring. Patient is here voluntarily.  Patient medically clear for TTS evaluation.  TTS evaluation pending.  Final Clinical Impressions(s) / ED Diagnoses   Final diagnoses:    None    New Prescriptions New Prescriptions   No medications on file     Roxy HorsemanRobert Dez Stauffer, PA-C 05/22/16 [id number]    Zadie Rhineonald Wickline, MD 05/22/16 [phone]

## 2016-05-22 NOTE — Tx Team (Signed)
Interdisciplinary Treatment and Diagnostic Plan Update  05/22/2016 Time of Session: 9:30 AM Meredith Lawrence MRN: [id number]  Principal Diagnosis: Moderate mixed bipolar I disorder (HCC)  Secondary Diagnoses: Principal Problem:   Moderate mixed bipolar I disorder (HCC)   Current Medications:  Current Facility-Administered Medications  Medication Dose Route Frequency Provider Last Rate Last Dose  . acetaminophen (TYLENOL) tablet 650 mg  650 mg Oral Q6H PRN Adonis Brook, NP      . alum & mag hydroxide-simeth (MAALOX/MYLANTA) 200-200-20 MG/5ML suspension 30 mL  30 mL Oral Q4H PRN Adonis Brook, NP      . benzocaine (ORAJEL) 10 % mucosal gel   Mouth/Throat QID PRN Acquanetta Sit, MD      . buPROPion (WELLBUTRIN XL) 24 hr tablet 150 mg  150 mg Oral Daily Adonis Brook, NP      . diclofenac (VOLTAREN) EC tablet 75 mg  75 mg Oral BID Adonis Brook, NP      . ibuprofen (ADVIL,MOTRIN) tablet 800 mg  800 mg Oral Q8H PRN Adonis Brook, NP      . Melene Muller ON 05/23/2016] Influenza vac split quadrivalent PF (FLUARIX) injection 0.5 mL  0.5 mL Intramuscular Tomorrow-1000 Adonis Brook, NP      . magnesium hydroxide (MILK OF MAGNESIA) suspension 30 mL  30 mL Oral Daily PRN Adonis Brook, NP      . pantoprazole (PROTONIX) EC tablet 40 mg  40 mg Oral Daily Adonis Brook, NP      . Melene Muller ON 05/23/2016] pneumococcal 23 valent vaccine (PNU-IMMUNE) injection 0.5 mL  0.5 mL Intramuscular Tomorrow-1000 Adonis Brook, NP      . QUEtiapine (SEROQUEL) tablet 50 mg  50 mg Oral QHS Acquanetta Sit, MD      . traZODone (DESYREL) tablet 50 mg  50 mg Oral QHS PRN Adonis Brook, NP       PTA Medications: Prescriptions Prior to Admission  Medication Sig Dispense Refill Last Dose  . omeprazole (PRILOSEC) 40 MG capsule Take 1 capsule (40 mg total) by mouth daily. 30 capsule 2 Past Month at Unknown time  . buPROPion (WELLBUTRIN XL) 150 MG 24 hr tablet Take 1 tablet (150 mg total) by mouth daily.  (Patient not taking: Reported on 05/22/2016) 30 tablet 1 Not Taking at Unknown time  . diclofenac (VOLTAREN) 75 MG EC tablet Take 1 tablet (75 mg total) by mouth 2 (two) times daily. (Patient not taking: Reported on 05/22/2016) 30 tablet 1 Not Taking at Unknown time  . HYDROcodone-acetaminophen (NORCO/VICODIN) 5-325 MG tablet Take 1 tablet by mouth every 4 (four) hours as needed for severe pain. (Patient not taking: Reported on 05/22/2016) 10 tablet 0 Not Taking at Unknown time  . ibuprofen (ADVIL,MOTRIN) 800 MG tablet Take 1 tablet (800 mg total) by mouth every 8 (eight) hours as needed for mild pain or moderate pain. (Patient not taking: Reported on 05/22/2016) 15 tablet 0 Not Taking at Unknown time    Patient Stressors:    Social isolation, lack of transportation  Patient Strengths:  likes pets, proud of son  Treatment Modalities: Medication Management, Group therapy, Case management,  1 to 1 session with clinician, Psychoeducation, Recreational therapy.   Physician Treatment Plan for Primary Diagnosis: Moderate mixed bipolar I disorder (HCC) Long Term Goal(s): Improvement in symptoms so as ready for discharge Improvement in symptoms so as ready for discharge   Short Term Goals: Ability to identify changes in lifestyle to reduce recurrence of condition will improve Ability to demonstrate self-control will  improve Ability to identify and develop effective coping behaviors will improve Compliance with prescribed medications will improve Ability to identify triggers associated with substance abuse/mental health issues will improve Ability to identify changes in lifestyle to reduce recurrence of condition will improve Ability to demonstrate self-control will improve Ability to identify and develop effective coping behaviors will improve Compliance with prescribed medications will improve Ability to identify triggers associated with substance abuse/mental health issues will  improve  Medication Management: Evaluate patient's response, side effects, and tolerance of medication regimen.  Therapeutic Interventions: 1 to 1 sessions, Unit Group sessions and Medication administration.  Evaluation of Outcomes: Progressing  Physician Treatment Plan for Secondary Diagnosis: Principal Problem:   Moderate mixed bipolar I disorder (HCC)  Long Term Goal(s): Improvement in symptoms so as ready for discharge Improvement in symptoms so as ready for discharge   Short Term Goals: Ability to identify changes in lifestyle to reduce recurrence of condition will improve Ability to demonstrate self-control will improve Ability to identify and develop effective coping behaviors will improve Compliance with prescribed medications will improve Ability to identify triggers associated with substance abuse/mental health issues will improve Ability to identify changes in lifestyle to reduce recurrence of condition will improve Ability to demonstrate self-control will improve Ability to identify and develop effective coping behaviors will improve Compliance with prescribed medications will improve Ability to identify triggers associated with substance abuse/mental health issues will improve     Medication Management: Evaluate patient's response, side effects, and tolerance of medication regimen.  Therapeutic Interventions: 1 to 1 sessions, Unit Group sessions and Medication administration.  Evaluation of Outcomes: Progressing   RN Treatment Plan for Primary Diagnosis: Moderate mixed bipolar I disorder (HCC) Long Term Goal(s): Knowledge of disease and therapeutic regimen to maintain health will improve  Short Term Goals: Ability to verbalize frustration and anger appropriately will improve, Ability to demonstrate self-control and Ability to identify and develop effective coping behaviors will improve  Medication Management: RN will administer medications as ordered by provider,  will assess and evaluate patient's response and provide education to patient for prescribed medication. RN will report any adverse and/or side effects to prescribing provider.  Therapeutic Interventions: 1 on 1 counseling sessions, Psychoeducation, Medication administration, Evaluate responses to treatment, Monitor vital signs and CBGs as ordered, Perform/monitor CIWA, COWS, AIMS and Fall Risk screenings as ordered, Perform wound care treatments as ordered.  Evaluation of Outcomes: Progressing   LCSW Treatment Plan for Primary Diagnosis: Moderate mixed bipolar I disorder (HCC) Long Term Goal(s): Safe transition to appropriate next level of care at discharge, Engage patient in therapeutic group addressing interpersonal concerns.  Short Term Goals: Engage patient in aftercare planning with referrals and resources, Increase ability to appropriately verbalize feelings, Increase emotional regulation and Increase skills for wellness and recovery  Therapeutic Interventions: Assess for all discharge needs, 1 to 1 time with Social worker, Explore available resources and support systems, Assess for adequacy in community support network, Educate family and significant other(s) on suicide prevention, Complete Psychosocial Assessment, Interpersonal group therapy.  Evaluation of Outcomes: Progressing   Progress in Treatment: Attending groups: No. Participating in groups: No. Taking medication as prescribed: Yes. Toleration medication: Yes. Family/Significant other contact made: No, will contact:  patient refused all collateral contact Patient understands diagnosis: No. and As evidenced by:  denies issue w alcohol, states her significant problem is anger Discussing patient identified problems/goals with staff: Yes. Medical problems stabilized or resolved: Yes. Denies suicidal/homicidal ideation: Yes, however admitted with impulsivity, irritability, social  isolation, feelings of being trapped, limited  coping skills, significant impulsivity Issues/concerns per patient self-inventory: No. Other: na  New problem(s) identified: No, Describe:  none at this time  New Short Term/Long Term Goal(s):  Aftercare that patient can access, no insurance or transportation resources  Discharge Plan or Barriers: Pt states she has no transportation, no social support, cannot get to appointments.  Mobile crisis management transported her to current hospitalization.  Reason for Continuation of Hospitalization: Aggression Anxiety Depression Medication stabilization  Estimated Length of Stay: 3 - 5 days  Attendees: Patient: 05/22/2016 2:54 PM  Physician: Richarda OverlieE Oates, MD 05/22/2016 2:54 PM  Nursing: Thedore MinsPenny RN, Christa RN 05/22/2016 2:54 PM  RN Care Manager: Onnie BoerJennifer Clark, RN CM 05/22/2016 2:54 PM  Social Worker: Santa GeneraAnne Cunningham LCSW 05/22/2016 2:54 PM  Recreational Therapist:  05/22/2016 2:54 PM  Other:  05/22/2016 2:54 PM  Other:  05/22/2016 2:54 PM  Other: 05/22/2016 2:54 PM    Scribe for Treatment Team: Sallee LangeAnne C Cunningham, LCSW 05/22/2016 2:54 PM

## 2016-05-22 NOTE — Progress Notes (Signed)
D   Pt was pleasant on approach and cooperative   She did not attend karaoke group this evening   She said I am a loner and crowds make me nervous    She remained in her room    She does report some depression and anxiety   She expressed concern over her dose of seroquel but did take the medication with no problem A    Verbal support given   Medications administered and effectiveness monitored Q 15 min checks    Discussed medications with patient to reasssure of a safe dose  R   Pt is safe and receptive to verbal support

## 2016-05-23 MED ORDER — BUPROPION HCL ER (XL) 150 MG PO TB24: 150.0000 mg | ORAL_TABLET | Freq: Every day | ORAL | 0 refills | Status: DC

## 2016-05-23 MED ORDER — QUETIAPINE FUMARATE 50 MG PO TABS: 50.0000 mg | ORAL_TABLET | Freq: Every day | ORAL | 0 refills | Status: DC

## 2016-05-23 MED ORDER — DICLOFENAC SODIUM 75 MG PO TBEC: 75.0000 mg | DELAYED_RELEASE_TABLET | Freq: Two times a day (BID) | ORAL | 0 refills | Status: DC

## 2016-05-23 MED ORDER — TRAZODONE HCL 50 MG PO TABS: 50.0000 mg | ORAL_TABLET | Freq: Every evening | ORAL | 0 refills | Status: DC | PRN

## 2016-05-23 MED ORDER — PANTOPRAZOLE SODIUM 40 MG PO TBEC: 40.0000 mg | DELAYED_RELEASE_TABLET | Freq: Every day | ORAL | 0 refills | Status: DC

## 2016-05-23 NOTE — [facility] Group Notes (Signed)
[facility] LCSW Group Therapy  05/23/2016 2:46 PM  Type of Therapy:  Group Therapy  Participation Level:  Minimal  Participation Quality:  Sharing  Affect:  Blunted  Cognitive:  Alert and Appropriate  Insight:  Developing/Improving  Engagement in Therapy:  Engaged  Modes of Intervention:  Discussion, Education and Exploration  Summary of Progress/Problems:  Group consisted of relapse prevention and a discussion about the stages of change.  Each member was given information about the stages of change and asked to process where they were with regards to relapse prevention and change.  Meredith Lawrence was a planned discharge and attended group for less that 10 minutes. She completed education but was unable to participate in discussion because of her discharge.  Meredith Lawrence, Meredith Lawrence 05/23/2016, 2:46 PM

## 2016-05-23 NOTE — Progress Notes (Signed)
Nutrition Brief Note  Patient identified on the Malnutrition Screening Tool (MST) Report  Wt Readings from Last 15 Encounters:  05/22/16 127 lb (57.6 kg)  05/22/16 127 lb 1 oz (57.6 kg)  03/03/16 131 lb (59.4 kg)  02/28/14 131 lb (59.4 kg)  02/08/14 131 lb (59.4 kg)    Body mass index is 24.39 kg/m. Patient meets criteria for normal weight based on current BMI.   Current diet order is Regular and patient is eating as desired for meals and snacks. Medications reviewed.   No nutrition interventions warranted at this time. If nutrition issues arise, please consult RD.     Trenton GammonJessica Shylynn Bruning, MS, RD, LDN Inpatient Clinical Dietitian Pager # [phone] After hours/weekend pager # [phone]

## 2016-05-23 NOTE — Progress Notes (Addendum)
  [facility]  Will you be returning to the same living situation after discharge:  Yes,  returning home At discharge, do you have transportation home?: Yes,  family coming to pick patient up at [employer], given bus pass to get to pick up  Do you have the ability to pay for your medications: Yes,  no barriers  Release of information consent forms completed and in the chart;  Patient's signature needed at discharge.  Patient to Follow up at: Follow-up Information    [facility] Follow up on 05/23/2016.   Specialty:  Behavioral Health Why:  Walk in appointment from 8am-3pm for assessment/therapy and medications. Contact information: [address] [place] [address] [phone]           Next level of care provider [facility]:no  Safety Planning and Suicide Prevention discussed: Yes,  with patient  Have you used any form of tobacco in the last 30 days? (Cigarettes, Smokeless Tobacco, Cigars, and/or Pipes): Yes  Has patient been referred to the Quitline?: Patient refused referral  Patient has been referred for addiction treatment: N/A  Raye SorrowCoble, Robinn Overholt N 05/23/2016, 1:04 PM
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
## 2016-05-23 NOTE — Progress Notes (Signed)
Recreation Therapy Notes  Date: 05/23/16 Time: 0930 Location: [address] Dayroom  Group Topic: Stress Management  Goal Area(s) Addresses:  Patient will verbalize importance of using healthy stress management.  Patient will identify positive emotions associated with healthy stress management.   Behavioral Response: Engaged  Intervention: Guided Imagery Script  Activity :  Forest Visualization.  LRT introduced the stress management technique of guided imagery.  LRT read a script that allowed patients to participate in the activity.  Patients were to follow along as LRT read script.    Education:  Stress Management, Discharge Planning.   Education Outcome: Acknowledges edcuation/In group clarification offered/Needs additional education  Clinical Observations/Feedback: Pt attended group.    Azjah Pardo, LRT/CTRS         Cody Oliger A 05/23/2016 12:34 PM 
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
## 2016-05-23 NOTE — Progress Notes (Signed)
Pt requested 72 hour request for discharge as soon as meeting with RN. Stated that she has 4 pets that she needs to provide care for, and has no one else available to help. Discussed patient's recent HI and anger issues - pt. Stated "I always feel like killing someone but I never would. I'm just ill that Brissette. I'm a cranky person". Laughed with nurse, cordial and open. Met with MD and will be discharged today.

## 2016-05-23 NOTE — Progress Notes (Signed)
Patient to discharged ambulatory and stable to lobby. All discharge paperwork given and signed, valuables returned, bus pass given. Prescriptions given. Patient able to verbalize understanding. Patient stable, denies SI/HI/AVH. Patient given opportunity to express concerns and ask questions. Pt thanked staff for the care she received.

## 2016-05-24 LAB — PROLACTIN: Prolactin: 12.4 ng/mL (ref 4.8–23.3)

## 2016-05-24 LAB — HEMOGLOBIN A1C
Hgb A1c MFr Bld: 5.3 % (ref 4.8–5.6)
Mean Plasma Glucose: 105 mg/dL

## 2016-09-13 ENCOUNTER — Emergency Department (HOSPITAL_COMMUNITY)
Admission: EM | Admit: 2016-09-13 | Discharge: 2016-09-13 | Disposition: A | Discharge status: Home / Self Care | Payer: Self-pay | Attending: Emergency Medicine | Admitting: Emergency Medicine

## 2016-09-13 ENCOUNTER — Emergency Department (HOSPITAL_COMMUNITY): Payer: Self-pay

## 2016-09-13 ENCOUNTER — Encounter (HOSPITAL_COMMUNITY): Payer: Self-pay

## 2016-09-13 DIAGNOSIS — J40 Bronchitis, not specified as acute or chronic: Secondary | ICD-10-CM | POA: Insufficient documentation

## 2016-09-13 DIAGNOSIS — Z79899 Other long term (current) drug therapy: Secondary | ICD-10-CM | POA: Insufficient documentation

## 2016-09-13 DIAGNOSIS — F17210 Nicotine dependence, cigarettes, uncomplicated: Secondary | ICD-10-CM | POA: Insufficient documentation

## 2016-09-13 MED ORDER — ALBUTEROL SULFATE HFA 108 (90 BASE) MCG/ACT IN AERS: 1.0000 | INHALATION_SPRAY | Freq: Four times a day (QID) | RESPIRATORY_TRACT | 0 refills | Status: DC | PRN

## 2016-09-13 MED ORDER — AZITHROMYCIN 250 MG PO TABS: ORAL_TABLET | ORAL | 0 refills | Status: DC

## 2016-09-13 MED ORDER — PREDNISONE 10 MG PO TABS: 20.0000 mg | ORAL_TABLET | Freq: Every day | ORAL | 0 refills | Status: DC

## 2016-09-13 MED ORDER — HYDROCODONE-HOMATROPINE 5-1.5 MG/5ML PO SYRP: 5.0000 mL | ORAL_SOLUTION | Freq: Four times a day (QID) | ORAL | 0 refills | Status: DC | PRN

## 2016-09-13 NOTE — ED Provider Notes (Signed)
[facility]-EMERGENCY DEPT Provider Note   CSN: [id number] Arrival date & time: 09/13/16  1045  By signing my name below, I, Meredith Lawrence, attest that this documentation has been prepared under the direction and in the presence of Meredith HutchingBrian Dontarious Schaum, MD . Electronically Signed: Majel HomerPeyton Lawrence, Scribe. 09/13/2016. 11:23 AM.  History   Chief Complaint Chief Complaint  Patient presents with  . Cough   The history is provided by the patient. No language interpreter was used.   HPI Comments: Meredith Lawrence is a 56 y.o. female with no pertinent PMHx, who presents to the Emergency Department complaining of gradually worsening, cough that began ~5 days ago. Pt reports associated central chest soreness over her sternum secondary to her cough, chills, generalized body aches, and fever (tmax 100.9) yesterday that has now resolved. She states she received a flu test in October, 2017 that was negative. Pt denies shortness of breath and any other complaints. She states she smokes ~1 pack of cigarettes every day.   Past Medical History:  Diagnosis Date  . Depression    Patient Active Problem List   Diagnosis Date Noted  . Substance induced mood disorder (HCC) 05/22/2016  . Moderate mixed bipolar I disorder (HCC) 05/22/2016  . Diabetes mellitus screening 02/09/2014  . Other malaise and fatigue 02/09/2014  . Other screening mammogram 02/09/2014  . Insomnia 02/09/2014  . Cerumen impaction 02/09/2014  . Encounter for screening colonoscopy 02/09/2014  . Depression 02/08/2014  . Arthritis 02/08/2014  . GERD without esophagitis 02/08/2014  . Anxiety state, unspecified 02/08/2014  . Need for Tdap vaccination 02/08/2014  . Tobacco dependence 02/08/2014  . 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 EYE SURGERY      OB History    No data available     Home Medications    Prior to Admission medications   Medication Sig Start Date End Date Taking?  Authorizing Provider  esomeprazole (NEXIUM) 40 MG capsule Take 40 mg by mouth daily at 12 noon.   Yes Historical Provider, MD  albuterol (PROVENTIL HFA;VENTOLIN HFA) 108 (90 Base) MCG/ACT inhaler Inhale 1-2 puffs into the lungs every 6 (six) hours as needed for wheezing or shortness of breath. 09/13/16   Meredith HutchingBrian Maikayla Beggs, MD  azithromycin (ZITHROMAX Z-PAK) 250 MG tablet As directed 09/13/16   Meredith HutchingBrian Lekisha Mcghee, MD  HYDROcodone-homatropine [facility](HYCODAN) 5-1.5 MG/5ML syrup Take 5 mLs by mouth every 6 (six) hours as needed for cough. 09/13/16   Meredith HutchingBrian Jessee Newnam, MD  predniSONE (DELTASONE) 10 MG tablet Take 2 tablets (20 mg total) by mouth daily. 09/13/16   Meredith HutchingBrian Desteny Freeman, MD    Family History No family history on file.  Social History Social History  Substance Use Topics  . Smoking status: Current Every Day Smoker    Packs/day: 1.00    Types: Cigarettes  . Smokeless tobacco: Never Used  . Alcohol use 4.2 - 5.4 oz/week    6 - 8 Cans of beer, 1 Glasses of wine per week     Allergies   Patient has no known allergies.   Review of Systems Review of Systems  Constitutional: Positive for chills and fever.  HENT: Positive for congestion.   Respiratory: Positive for cough. Negative for shortness of breath.   Cardiovascular: Positive for chest pain (chest soreness secondary to her cough).  Musculoskeletal: Positive for myalgias.  All other systems reviewed and are negative.  Physical Exam Updated Vital Signs BP (!) 134/104 (BP Location:  Left Arm)   Pulse 85   Temp 98.2 F (36.8 C) (Oral)   Resp 22   Ht 5' (1.524 m)   Wt 135 lb (61.2 kg)   SpO2 98%   BMI 26.37 kg/m   Physical Exam  Constitutional: She is oriented to person, place, and time. She appears well-developed and well-nourished.  HENT:  Head: Normocephalic and atraumatic.  Eyes: Conjunctivae are normal.  Neck: Neck supple.  Cardiovascular: Normal rate and regular rhythm.   Pulmonary/Chest: Effort normal and breath sounds normal.  Abdominal: Soft.  Bowel sounds are normal.  Musculoskeletal: Normal range of motion.  Neurological: She is alert and oriented to person, place, and time.  Skin: Skin is warm and dry.  Psychiatric: She has a normal mood and affect. Her behavior is normal.  Nursing note and vitals reviewed.  ED Treatments / Results  DIAGNOSTIC STUDIES:  Oxygen Saturation is 98% on RA, normal by my interpretation.    COORDINATION OF CARE:  11:20 AM Discussed treatment plan with pt at bedside and pt agreed to plan. Plan to prescribe pt an inhaler, prednisone, antibiotic, and cough syrup.   Labs (all labs ordered are listed, but only abnormal results are displayed) Labs Reviewed - No data to display  EKG  EKG Interpretation None       Radiology Dg Chest 2 View  Result Date: 09/13/2016 CLINICAL DATA:  56 year old female with productive cough, chest pain from coughing. Runny nose. Fever since yesterday. Initial encounter. Smoker. EXAM: CHEST  2 VIEW COMPARISON:  None. FINDINGS: Mild levoconvex thoracic spine scoliosis. Mild chronic appearing thoracolumbar junction level compression fracture. No acute osseous abnormality identified. Lung volumes are at the upper limits of normal to mildly hyperinflated. Normal cardiac size and mediastinal contours. Visualized tracheal air column is within normal limits. No pneumothorax, pulmonary edema, pleural effusion or confluent pulmonary opacity. Mild diffuse increased interstitial markings likely related to smoking. Negative visible bowel gas pattern. IMPRESSION: No acute cardiopulmonary abnormality. Electronically Signed   By: Meredith Lawrence M.D.   On: 09/13/2016 11:51   Procedures Procedures (including critical care time)  Medications Ordered in ED Medications - No data to display  Initial Impression / Assessment and Plan / ED Course  I have reviewed the triage vital signs and the nursing notes.  Pertinent labs & imaging results that were available during my care of the patient were  reviewed by me and considered in my medical decision making (see chart for details).   patient is nontoxic-appearing. Chest x-ray negative for pneumonia. Discharge medications Zithromax, albuterol inhaler, prednisone, Hycodan cough syrup.  I personally performed the services described in this documentation, which was scribed in my presence. The recorded information has been reviewed and is accurate.    Final Clinical Impressions(s) / ED Diagnoses   Final diagnoses:  Bronchitis    New Prescriptions New Prescriptions   ALBUTEROL (PROVENTIL HFA;VENTOLIN HFA) 108 (90 BASE) MCG/ACT INHALER    Inhale 1-2 puffs into the lungs every 6 (six) hours as needed for wheezing or shortness of breath.   AZITHROMYCIN (ZITHROMAX Z-PAK) 250 MG TABLET    As directed   HYDROCODONE-HOMATROPINE (HYCODAN) 5-1.5 MG/5ML SYRUP    Take 5 mLs by mouth every 6 (six) hours as needed for cough.   PREDNISONE (DELTASONE) 10 MG TABLET    Take 2 tablets (20 mg total) by mouth daily.     Meredith Hutching, MD 09/13/16 [phone]

## 2016-09-13 NOTE — Discharge Instructions (Signed)
Chest x-ray showed no pneumonia. Increase fluids. Stop smoking. Prescriptions for antibiotic, cough syrup, inhaler, prednisone.

## 2016-09-13 NOTE — ED Triage Notes (Signed)
Cough, chills and body aches x5 days with no relief with OTC medications. Reports a temp of 100.9 yesterday.

## 2018-02-22 IMAGING — DX DG CHEST 2V
2 series · 2 of 2 positions shown · non-contrast
Comparison: None.

CLINICAL DATA: 55-year-old female with productive cough, chest pain
from coughing. Runny nose. Fever since yesterday. Initial encounter.
Smoker.

EXAM:
CHEST  2 VIEW

[chest pa]
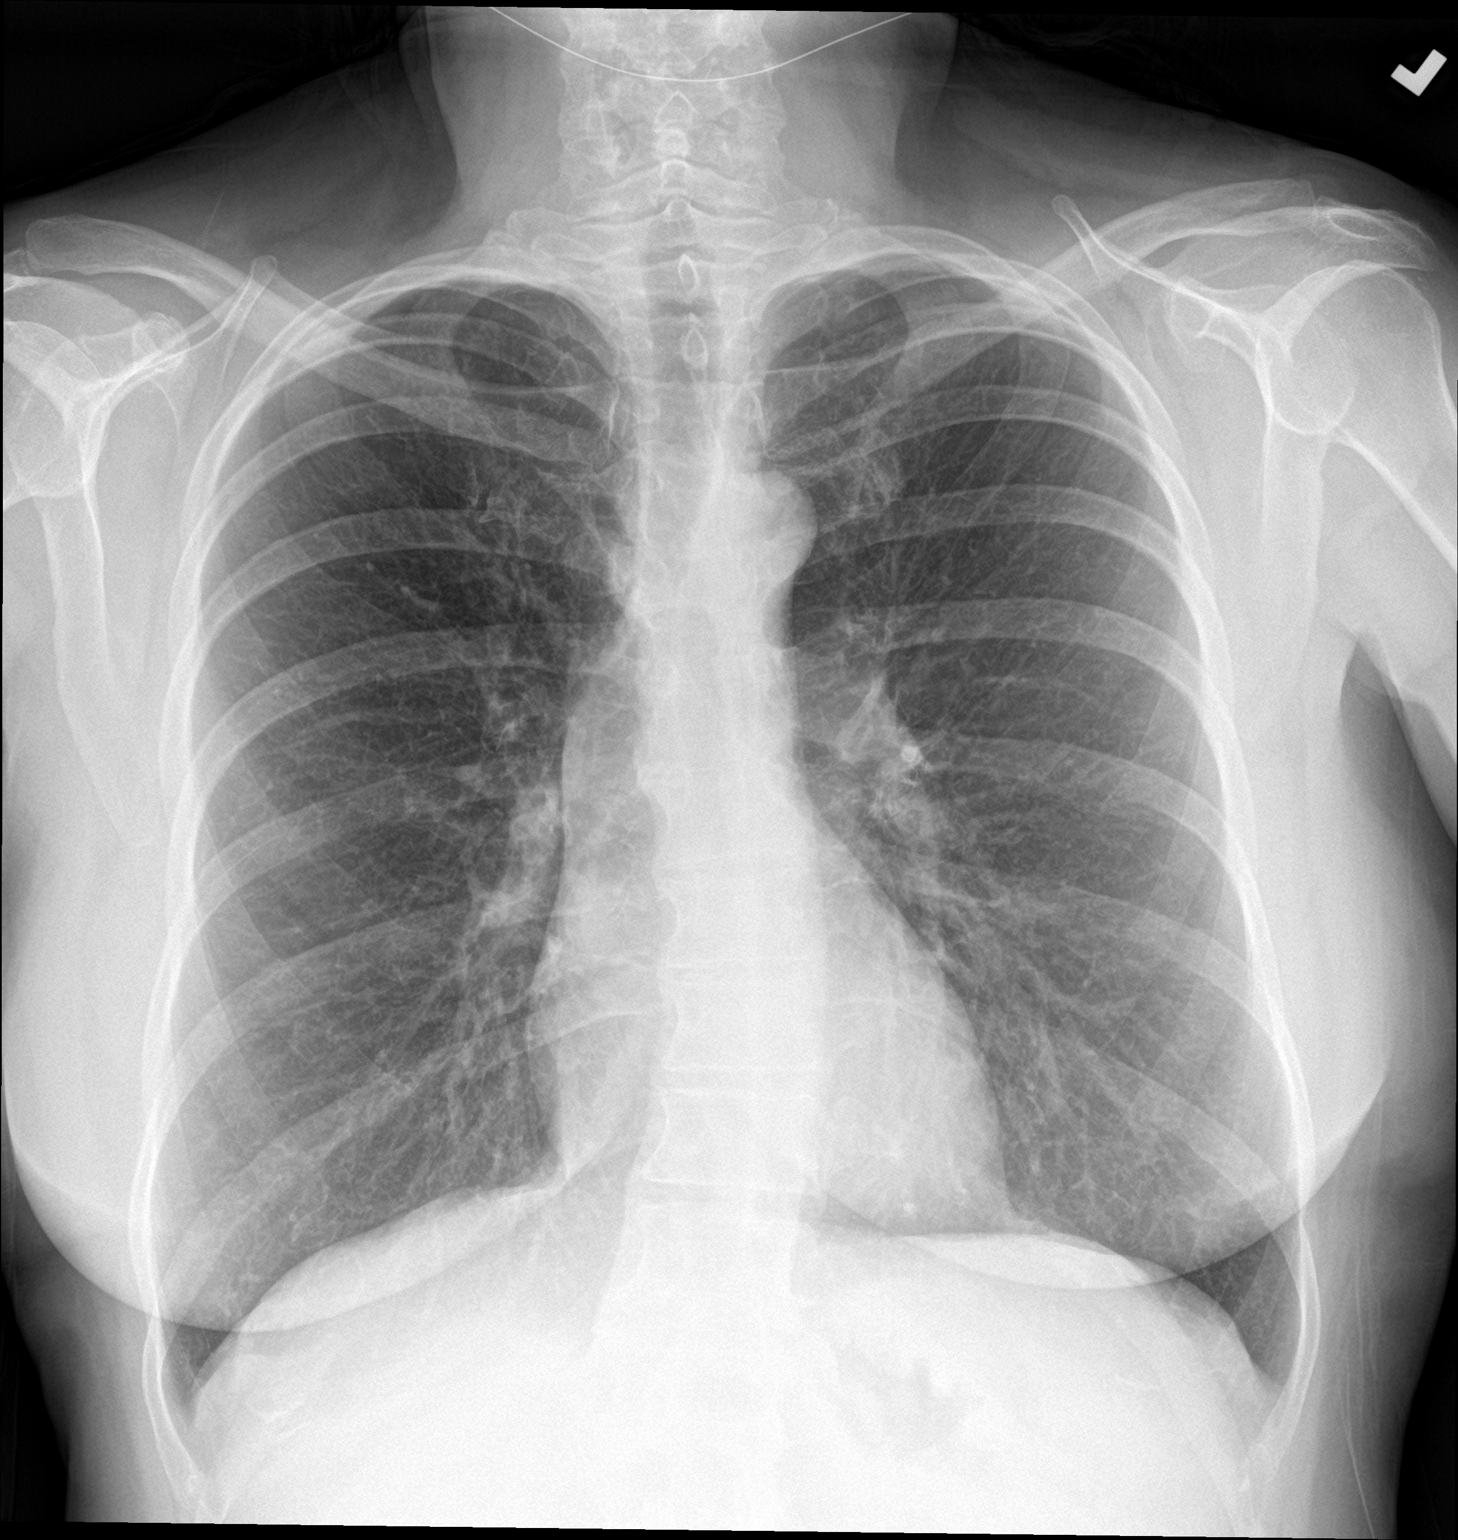

[chest lat]
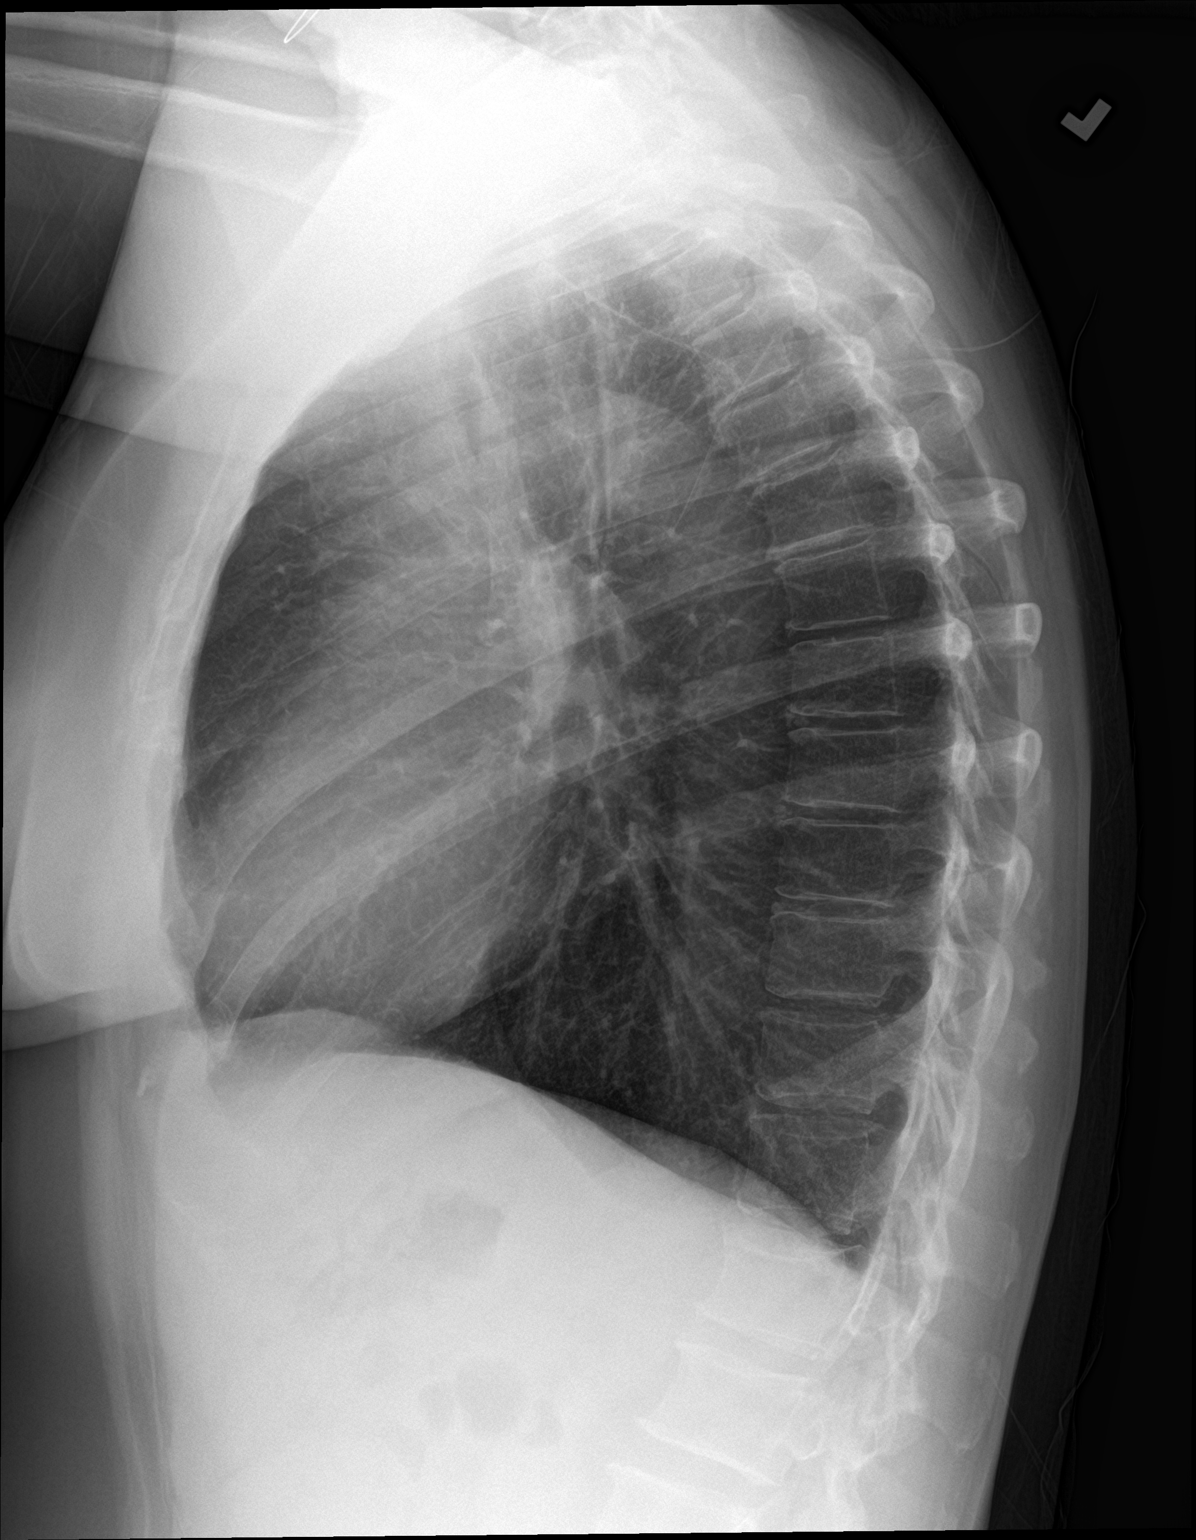

[2 of 2 positions shown; findings below may reference images not displayed]

FINDINGS: Mild levoconvex thoracic spine scoliosis. Mild chronic appearing
thoracolumbar junction level compression fracture. No acute osseous
abnormality identified.

Lung volumes are at the upper limits of normal to mildly
hyperinflated. Normal cardiac size and mediastinal contours.
Visualized tracheal air column is within normal limits. No
pneumothorax, pulmonary edema, pleural effusion or confluent
pulmonary opacity. Mild diffuse increased interstitial markings
likely related to smoking.

Negative visible bowel gas pattern.
IMPRESSION: No acute cardiopulmonary abnormality.

## 2021-10-29 DIAGNOSIS — H5213 Myopia, bilateral: Secondary | ICD-10-CM | POA: Diagnosis not present

## 2021-11-19 DIAGNOSIS — F99 Mental disorder, not otherwise specified: Secondary | ICD-10-CM | POA: Diagnosis not present

## 2021-12-13 ENCOUNTER — Ambulatory Visit: Payer: Medicaid Other | Admitting: Family Medicine

## 2021-12-14 ENCOUNTER — Other Ambulatory Visit: Payer: Self-pay

## 2021-12-14 NOTE — Congregational Nurse Program (Unsigned)
?  Dept: [phone] ? ? ?Congregational Nurse Program Note ? ?Date of Encounter: 12/16/2021 ? ? ?Past Medical History: ?Past Medical History:  ?Diagnosis Date  ? Depression   ? ? ?Encounter Details: ? ? ? ?Meredith Lawrence called this morning asking for help getting to [facility] ED. Stated she does not have the keys to her car. She stated going to hospital because  feeling stressed and having pain from crippling arthritis. Needing something for pain. Does not want 911, " I don't trust them"  Denies being suicidal, stated if " But if I went to sleep and never woke up would ok"  " I feel as if my neighbors try to kill me" They are conspiring to kill because of land overlap".  ?She presents with disorganized speech and flight of ideas. She called again to state she found a ride from a [employer] tech.  The tech agreed to give her a ride to the hospital and buy her medication.  ?I called the ED to let them know Meredith Lawrence stated she was trying to get there.  ?

## 2021-12-15 ENCOUNTER — Other Ambulatory Visit: Payer: Self-pay

## 2021-12-17 ENCOUNTER — Telehealth: Payer: Self-pay

## 2021-12-17 ENCOUNTER — Other Ambulatory Visit: Payer: Self-pay

## 2021-12-20 ENCOUNTER — Ambulatory Visit: Payer: Medicaid Other | Admitting: Family Medicine

## 2021-12-27 ENCOUNTER — Other Ambulatory Visit: Payer: Self-pay

## 2021-12-27 ENCOUNTER — Emergency Department (HOSPITAL_COMMUNITY)
Admission: EM | Admit: 2021-12-27 | Discharge: 2021-12-27 | Disposition: A | Discharge status: Home / Self Care | Payer: Medicaid Other | Attending: Emergency Medicine | Admitting: Emergency Medicine

## 2021-12-27 ENCOUNTER — Encounter (HOSPITAL_COMMUNITY): Payer: Self-pay | Admitting: *Deleted

## 2021-12-27 DIAGNOSIS — K0889 Other specified disorders of teeth and supporting structures: Secondary | ICD-10-CM | POA: Insufficient documentation

## 2021-12-27 MED ORDER — AMOXICILLIN-POT CLAVULANATE 875-125 MG PO TABS
1.0000 | ORAL_TABLET | Freq: Once | ORAL | Status: AC
  Administered 2021-12-27: 1 via ORAL
  Filled 2021-12-27: qty 1

## 2021-12-27 MED ORDER — KETOROLAC TROMETHAMINE 15 MG/ML IJ SOLN
15.0000 mg | Freq: Once | INTRAMUSCULAR | Status: AC
  Administered 2021-12-27: 15 mg via INTRAMUSCULAR
  Filled 2021-12-27: qty 1

## 2021-12-27 MED ORDER — AMOXICILLIN-POT CLAVULANATE 875-125 MG PO TABS: 1.0000 | ORAL_TABLET | Freq: Two times a day (BID) | ORAL | 0 refills | Status: DC

## 2021-12-27 NOTE — ED Triage Notes (Signed)
PT c/o upper mid dental pain that is chronic but worsening last night, pt tearful in triage, A&O x4

## 2021-12-27 NOTE — Discharge Instructions (Signed)
Please pick up your antibiotics and take as prescribed.  I would like for you to return to the emergency department for any worsening symptoms that you might have.

## 2021-12-27 NOTE — ED Provider Notes (Signed)
[facility] EMERGENCY DEPARTMENT Provider Note   CSN: [id number] Arrival date & time: 12/27/21  1718     History Chief Complaint  Patient presents with   Dental Pain    Meredith Lawrence is a 61 y.o. female who presents to the emergency department today for evaluation of dental pain.  Patient states that she has been having increasing swelling of the front upper gums.  Does state that she injured them back in April.  She denies fevers or chills.  No trouble swallowing or talking.   Dental Pain     Home Medications Prior to Admission medications   Medication Sig Start Date End Date Taking? Authorizing Provider  acetaminophen (TYLENOL) 325 MG tablet Take 650 mg by mouth every 6 (six) hours as needed for moderate pain.   Yes [provider]  amoxicillin-clavulanate (AUGMENTIN) 875-125 MG tablet Take 1 tablet by mouth every 12 (twelve) hours. 12/27/21  Yes Meredeth Ide, Celia Friedland M, PA-C  omeprazole (PRILOSEC OTC) 20 MG tablet Take 20 mg by mouth daily.   Yes [provider]      Allergies    Patient has no known allergies.    Review of Systems   Review of Systems  All other systems reviewed and are negative.  Physical Exam Updated Vital Signs BP 127/68 (BP Location: Left Arm)   Pulse 100   Temp 98.5 F (36.9 C) (Oral)   Resp 18   Ht 5' (1.524 m)   SpO2 98%   BMI 26.37 kg/m  Physical Exam Vitals and nursing note reviewed.  Constitutional:      Appearance: Normal appearance.  HENT:     Head: Normocephalic and atraumatic.  Eyes:     General:        Right eye: No discharge.        Left eye: No discharge.     Conjunctiva/sclera: Conjunctivae normal.  Pulmonary:     Effort: Pulmonary effort is normal.  Skin:    General: Skin is warm and dry.     Findings: No rash.  Neurological:     General: No focal deficit present.     Mental Status: She is alert.  Psychiatric:        Mood and Affect: Mood normal.        Behavior: Behavior normal.    ED Results /  Procedures / Treatments   Labs (all labs ordered are listed, but only abnormal results are displayed) Labs Reviewed - No data to display  EKG None  Radiology No results found.  Procedures Procedures    Medications Ordered in ED Medications  amoxicillin-clavulanate (AUGMENTIN) 875-125 MG per tablet 1 tablet (has no administration in time range)  ketorolac (TORADOL) 15 MG/ML injection 15 mg (has no administration in time range)    ED Course/ Medical Decision Making/ A&P                           Medical Decision Making Meredith Lawrence is a 61 y.o. female who presents to the emergency department today for further evaluation of dental pain.  No obvious purulence over the upper central gumline.  There is evidence of swelling.  Patient does have poor dentition throughout.  I will treat her with Augmentin.  She will get her first dose here.  Also patient will get a shot of Toradol.  Patient is in no acute distress at this time.  I have a low suspicion for Ludwig's angina,  PTA, RPA.  No evidence of trismus.   Risk Prescription drug management.   Final Clinical Impression(s) / ED Diagnoses Final diagnoses:  Pain, dental    Rx / DC Orders ED Discharge Orders          Ordered    amoxicillin-clavulanate (AUGMENTIN) 875-125 MG tablet  Every 12 hours        12/27/21 1802              Honor Loh [employer], PA-C 12/27/21 1802    Vanetta Mulders, MD 01/02/22 0145

## 2022-02-17 NOTE — Congregational Nurse Program (Unsigned)
  Dept: [phone]   Congregational Nurse Program Note  Date of Encounter: 02/17/2022  Past Medical History: Past Medical History:  Diagnosis Date   Depression     Encounter Details:

## 2022-04-11 ENCOUNTER — Emergency Department (HOSPITAL_COMMUNITY)
Admission: EM | Admit: 2022-04-11 | Discharge: 2022-04-13 | Disposition: A | Discharge status: Home / Self Care | Payer: Medicaid Other | Attending: Emergency Medicine | Admitting: Emergency Medicine

## 2022-04-11 ENCOUNTER — Encounter (HOSPITAL_COMMUNITY): Payer: Self-pay | Admitting: Emergency Medicine

## 2022-04-11 ENCOUNTER — Other Ambulatory Visit: Payer: Self-pay

## 2022-04-11 DIAGNOSIS — F99 Mental disorder, not otherwise specified: Secondary | ICD-10-CM | POA: Insufficient documentation

## 2022-04-11 DIAGNOSIS — F419 Anxiety disorder, unspecified: Secondary | ICD-10-CM | POA: Diagnosis not present

## 2022-04-11 DIAGNOSIS — F3162 Bipolar disorder, current episode mixed, moderate: Secondary | ICD-10-CM | POA: Diagnosis present

## 2022-04-11 DIAGNOSIS — F32A Depression, unspecified: Secondary | ICD-10-CM | POA: Diagnosis not present

## 2022-04-11 DIAGNOSIS — F17210 Nicotine dependence, cigarettes, uncomplicated: Secondary | ICD-10-CM | POA: Diagnosis not present

## 2022-04-11 DIAGNOSIS — G4700 Insomnia, unspecified: Secondary | ICD-10-CM | POA: Diagnosis not present

## 2022-04-11 DIAGNOSIS — Z20822 Contact with and (suspected) exposure to covid-19: Secondary | ICD-10-CM | POA: Diagnosis not present

## 2022-04-11 DIAGNOSIS — F1994 Other psychoactive substance use, unspecified with psychoactive substance-induced mood disorder: Secondary | ICD-10-CM | POA: Diagnosis not present

## 2022-04-11 DIAGNOSIS — R9431 Abnormal electrocardiogram [ECG] [EKG]: Secondary | ICD-10-CM | POA: Diagnosis not present

## 2022-04-11 HISTORY — DX: Insomnia, unspecified: G47.00

## 2022-04-11 HISTORY — DX: Bipolar disorder, unspecified: F31.9

## 2022-04-11 LAB — CBC WITH DIFFERENTIAL/PLATELET
Abs Immature Granulocytes: 0.08 10*3/uL — ABNORMAL HIGH (ref 0.00–0.07)
Basophils Absolute: 0.1 10*3/uL (ref 0.0–0.1)
Basophils Relative: 1 %
Eosinophils Absolute: 0.1 10*3/uL (ref 0.0–0.5)
Eosinophils Relative: 1 %
HCT: 48.2 % — ABNORMAL HIGH (ref 36.0–46.0)
Hemoglobin: 16.1 g/dL — ABNORMAL HIGH (ref 12.0–15.0)
Immature Granulocytes: 1 %
Lymphocytes Relative: 27 %
Lymphs Abs: 2.6 10*3/uL (ref 0.7–4.0)
MCH: 29.4 pg (ref 26.0–34.0)
MCHC: 33.4 g/dL (ref 30.0–36.0)
MCV: 88 fL (ref 80.0–100.0)
Monocytes Absolute: 0.8 10*3/uL (ref 0.1–1.0)
Monocytes Relative: 9 %
Neutro Abs: 5.8 10*3/uL (ref 1.7–7.7)
Neutrophils Relative %: 61 %
Platelets: 297 10*3/uL (ref 150–400)
RBC: 5.48 MIL/uL — ABNORMAL HIGH (ref 3.87–5.11)
RDW: 13.1 % (ref 11.5–15.5)
WBC: 9.4 10*3/uL (ref 4.0–10.5)
nRBC: 0 % (ref 0.0–0.2)

## 2022-04-11 LAB — URINALYSIS, ROUTINE W REFLEX MICROSCOPIC
Bacteria, UA: NONE SEEN
Bilirubin Urine: NEGATIVE
Glucose, UA: NEGATIVE mg/dL
Ketones, ur: NEGATIVE mg/dL
Leukocytes,Ua: NEGATIVE
Nitrite: NEGATIVE
Protein, ur: NEGATIVE mg/dL
Specific Gravity, Urine: 1.012 (ref 1.005–1.030)
pH: 5 (ref 5.0–8.0)

## 2022-04-11 LAB — COMPREHENSIVE METABOLIC PANEL
ALT: 16 U/L (ref 0–44)
AST: 18 U/L (ref 15–41)
Albumin: 4.7 g/dL (ref 3.5–5.0)
Alkaline Phosphatase: 96 U/L (ref 38–126)
Anion gap: 10 (ref 5–15)
BUN: 22 mg/dL (ref 8–23)
CO2: 23 mmol/L (ref 22–32)
Calcium: 10.1 mg/dL (ref 8.9–10.3)
Chloride: 106 mmol/L (ref 98–111)
Creatinine, Ser: 0.66 mg/dL (ref 0.44–1.00)
GFR, Estimated: >60 mL/min (ref >60)
Glucose, Bld: 102 mg/dL — ABNORMAL HIGH (ref 70–99)
Potassium: 4.2 mmol/L (ref 3.5–5.1)
Sodium: 139 mmol/L (ref 135–145)
Total Bilirubin: 0.5 mg/dL (ref 0.3–1.2)
Total Protein: 8.1 g/dL (ref 6.5–8.1)

## 2022-04-11 LAB — RAPID URINE DRUG SCREEN, HOSP PERFORMED
Amphetamines: NOT DETECTED
Barbiturates: NOT DETECTED
Benzodiazepines: NOT DETECTED
Cocaine: NOT DETECTED
Opiates: NOT DETECTED
Tetrahydrocannabinol: POSITIVE — AB

## 2022-04-11 LAB — RESP PANEL BY RT-PCR (FLU A&B, COVID) ARPGX2
Influenza A by PCR: NEGATIVE
Influenza B by PCR: NEGATIVE
SARS Coronavirus 2 by RT PCR: NEGATIVE

## 2022-04-11 LAB — ACETAMINOPHEN LEVEL: Acetaminophen (Tylenol), Serum: 12 ug/mL (ref 10–30)

## 2022-04-11 LAB — ETHANOL: Alcohol, Ethyl (B): <10 mg/dL (ref <10)

## 2022-04-11 MED ORDER — OLANZAPINE 5 MG PO TBDP
10.0000 mg | ORAL_TABLET | Freq: Every day | ORAL | Status: DC
  Administered 2022-04-11 – 2022-04-13 (×3): 10 mg via ORAL
  Filled 2022-04-11 (×3): qty 2

## 2022-04-11 MED ORDER — ZIPRASIDONE MESYLATE 20 MG IM SOLR: 20.0000 mg | INTRAMUSCULAR | Status: DC | PRN

## 2022-04-11 MED ORDER — LORAZEPAM 1 MG PO TABS
1.0000 mg | ORAL_TABLET | ORAL | Status: AC | PRN
  Administered 2022-04-11: 1 mg via ORAL
  Filled 2022-04-11: qty 1

## 2022-04-11 MED ORDER — OLANZAPINE 5 MG PO TBDP: 5.0000 mg | ORAL_TABLET | Freq: Three times a day (TID) | ORAL | Status: DC | PRN
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
## 2022-04-11 NOTE — ED Notes (Signed)
Bag lunch given, pt sitting up in bed, resps even and unlabored, pt has no other requests at this time
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
## 2022-04-11 NOTE — ED Notes (Signed)
Pt refusing to relinquish her belongings to have them secured md and charge RN notified, charge rn talking with Hotel manager, at this time plan is to allow pt to retain her belongings and have security wand pt, security called by charge RN to wand pt.

## 2022-04-11 NOTE — ED Notes (Signed)
Pt in bed with eyes closed, pt arouses easily to verbal stimulation, pt denies pain, pt states that she ate her entire dinner tray. Offers no complaints and has no requests at this time.

## 2022-04-11 NOTE — ED Triage Notes (Signed)
Pt states hit head on tabel yesterday while trying to bend over to pick something up. Also states needs left ear cleaned out.
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
## 2022-04-11 NOTE — ED Triage Notes (Signed)
Pt states nerves are tore up that is causing hives, rash, diarrhea. Pt has an artifical left eye and she hasn't had it polished in years.

## 2022-04-12 DIAGNOSIS — F3162 Bipolar disorder, current episode mixed, moderate: Secondary | ICD-10-CM | POA: Diagnosis not present

## 2022-04-12 NOTE — ED Notes (Signed)
Pt on the phone. Conversation became loud and abrasive. Hospital policy about phone calls was explained to the patient. She was understanding and willing to retry her conversation.
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
## 2022-04-12 NOTE — ED Notes (Signed)
Pt ambulated to the bathroom with sitter 

## 2022-04-12 NOTE — ED Notes (Signed)
MD notified of inpatient recommendation by TTS.

## 2022-04-13 DIAGNOSIS — F3162 Bipolar disorder, current episode mixed, moderate: Secondary | ICD-10-CM

## 2022-04-13 NOTE — Progress Notes (Signed)
Per Laurey Morale, NP, patient meets criteria for inpatient treatment. There are no available beds at [place] [facility] today. CSW faxed referrals to the following facilities for review:  [facility]  Pending - No Request Sent N/A [address], [place] [address] [phone] --  [facility]  Pending - No Request Sent N/A [address] [place] [address] [phone] --  [facility]  Pending - No Request [address] [place] [address] [phone] --  [facility]  Pending - No Request Sent N/A [address] [place] [address] [phone] --  [facility]  Pending - No Request Sent N/A [address] [place] [address] [phone] --  [facility]  Pending - No Request Sent N/A [address] [place], [place] [address] [phone] --  [facility]  Pending - No Request Sent N/A [address] [place] [address] [phone] --  [facility]  Pending - No Request Sent N/A [address] [place] [address] [phone] --  [facility]  Pending - No Request Sent N/A [address] [place] [address] [phone] --  [facility]  Pending - No Request Sent N/A [address] [place] [address] [phone] --  [facility]  Pending - No Request Sent N/A [address], [place] [address] [phone] --  [facility]  Pending - No Request Sent N/A [address] [place] [address] [phone] --  [facility]  Pending - No Request Sent N/A [address] [place] [address] [phone] --  [facility]  Pending - No Request Sent N/A [address] [place] [address] [phone] --  [facility]  Pending - No Request Sent N/A [address] [place] [address] [phone] --  [facility]  Pending - No Request Sent N/A [address] [place] [address] [phone] --   TTS will continue to seek bed placement.  Crissie Reese, MSW, Lenice Pressman Phone: [phone] Disposition/TOC

## 2022-04-13 NOTE — ED Notes (Signed)
Pt was escorted by sitter for shower.

## 2022-04-13 NOTE — ED Notes (Signed)
Pt belonging bags returned to pt at this time. Security returning belongings from safe at this time.
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
## 2022-04-13 NOTE — ED Provider Notes (Signed)
Emergency Medicine Observation Re-evaluation Note  Meredith Lawrence is a 61 y.o. female, seen on rounds today.  Pt initially presented to the ED for complaints of Psychiatric Evaluation and Anxiety Currently, the patient is sleeping.  Physical Exam  BP (!) 138/91 (BP Location: Left Arm)   Pulse 88   Temp 97.8 F (36.6 C)   Resp 16   Wt 55.8 kg   SpO2 98%   BMI 24.02 kg/m  Physical Exam General: Sleeping Cardiac: Extremities well-perfused Lungs: Breathing is unlabored Psych: Deferred  ED Course / MDM  EKG:EKG Interpretation  Date/Time:  Friday April 11 2022 11:43:00 EDT Ventricular Rate:  78 PR Interval:  111 QRS Duration: 93 QT Interval:  374 QTC Calculation: 426 R Axis:   57 Text Interpretation: Sinus rhythm Borderline short PR interval Minimal ST elevation, inferior leads Confirmed by Gilda Crease [phone]) on 04/12/2022 3:49:24 PM  I have reviewed the labs performed to date as well as medications administered while in observation.  Recent changes in the last 24 hours include none.  Plan  Current plan is for inpatient psychiatric admission.    Gloris Manchester, MD 04/13/22 [phone]

## 2022-07-06 ENCOUNTER — Other Ambulatory Visit: Payer: Self-pay

## 2022-07-06 ENCOUNTER — Emergency Department (HOSPITAL_COMMUNITY)
Admission: EM | Admit: 2022-07-06 | Discharge: 2022-07-07 | Disposition: A | Discharge status: Home / Self Care | Payer: Medicaid Other | Attending: Emergency Medicine | Admitting: Emergency Medicine

## 2022-07-06 ENCOUNTER — Emergency Department (HOSPITAL_COMMUNITY): Payer: Medicaid Other

## 2022-07-06 ENCOUNTER — Encounter (HOSPITAL_COMMUNITY): Payer: Self-pay

## 2022-07-06 DIAGNOSIS — R251 Tremor, unspecified: Secondary | ICD-10-CM | POA: Diagnosis not present

## 2022-07-06 DIAGNOSIS — R0602 Shortness of breath: Secondary | ICD-10-CM | POA: Diagnosis not present

## 2022-07-06 DIAGNOSIS — R569 Unspecified convulsions: Secondary | ICD-10-CM | POA: Diagnosis not present

## 2022-07-06 DIAGNOSIS — R9431 Abnormal electrocardiogram [ECG] [EKG]: Secondary | ICD-10-CM | POA: Diagnosis not present

## 2022-07-06 DIAGNOSIS — R0689 Other abnormalities of breathing: Secondary | ICD-10-CM | POA: Diagnosis not present

## 2022-07-06 DIAGNOSIS — R062 Wheezing: Secondary | ICD-10-CM | POA: Diagnosis not present

## 2022-07-06 DIAGNOSIS — R55 Syncope and collapse: Secondary | ICD-10-CM | POA: Diagnosis not present

## 2022-07-06 LAB — CBC WITH DIFFERENTIAL/PLATELET
Abs Immature Granulocytes: 0.04 10*3/uL (ref 0.00–0.07)
Basophils Absolute: 0.1 10*3/uL (ref 0.0–0.1)
Basophils Relative: 1 %
Eosinophils Absolute: 0 10*3/uL (ref 0.0–0.5)
Eosinophils Relative: 0 %
HCT: 43.9 % (ref 36.0–46.0)
Hemoglobin: 15.2 g/dL — ABNORMAL HIGH (ref 12.0–15.0)
Immature Granulocytes: 0 %
Lymphocytes Relative: 12 %
Lymphs Abs: 1.6 10*3/uL (ref 0.7–4.0)
MCH: 30.1 pg (ref 26.0–34.0)
MCHC: 34.6 g/dL (ref 30.0–36.0)
MCV: 86.9 fL (ref 80.0–100.0)
Monocytes Absolute: 1 10*3/uL (ref 0.1–1.0)
Monocytes Relative: 7 %
Neutro Abs: 11.2 10*3/uL — ABNORMAL HIGH (ref 1.7–7.7)
Neutrophils Relative %: 80 %
Platelets: 257 10*3/uL (ref 150–400)
RBC: 5.05 MIL/uL (ref 3.87–5.11)
RDW: 12.8 % (ref 11.5–15.5)
WBC: 13.9 10*3/uL — ABNORMAL HIGH (ref 4.0–10.5)
nRBC: 0 % (ref 0.0–0.2)

## 2022-07-06 LAB — BASIC METABOLIC PANEL
Anion gap: 14 (ref 5–15)
BUN: 13 mg/dL (ref 8–23)
CO2: 21 mmol/L — ABNORMAL LOW (ref 22–32)
Calcium: 10 mg/dL (ref 8.9–10.3)
Chloride: 104 mmol/L (ref 98–111)
Creatinine, Ser: 0.64 mg/dL (ref 0.44–1.00)
GFR, Estimated: >60 mL/min (ref >60)
Glucose, Bld: 120 mg/dL — ABNORMAL HIGH (ref 70–99)
Potassium: 3.8 mmol/L (ref 3.5–5.1)
Sodium: 139 mmol/L (ref 135–145)

## 2022-07-06 LAB — ACETAMINOPHEN LEVEL: Acetaminophen (Tylenol), Serum: <10 ug/mL — ABNORMAL LOW (ref 10–30)

## 2022-07-06 LAB — SALICYLATE LEVEL: Salicylate Lvl: <7 mg/dL — ABNORMAL LOW (ref 7.0–30.0)

## 2022-07-06 LAB — MAGNESIUM: Magnesium: 1.9 mg/dL (ref 1.7–2.4)

## 2022-07-06 LAB — TROPONIN I (HIGH SENSITIVITY): Troponin I (High Sensitivity): 5 ng/L (ref <18)

## 2022-07-06 LAB — ETHANOL: Alcohol, Ethyl (B): <10 mg/dL (ref <10)
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
## 2022-07-06 NOTE — Care Plan (Signed)
pt is not compliant to CT, NP aware Officer is also asking if pt. Can be cleared so they can take her to jail, relayed message to triage in ED.
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
## 2022-07-06 NOTE — Care Plan (Signed)
CT done

## 2022-07-07 LAB — TROPONIN I (HIGH SENSITIVITY): Troponin I (High Sensitivity): 6 ng/L (ref <18)

## 2022-07-07 NOTE — ED Notes (Signed)
Patient discharged to custody of sheriff. Patient ambulatory out of department.
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
## 2022-07-07 NOTE — ED Notes (Signed)
Pt assisted to the bathroom.

## 2022-09-26 DIAGNOSIS — Z0001 Encounter for general adult medical examination with abnormal findings: Secondary | ICD-10-CM | POA: Diagnosis not present

## 2022-10-01 DIAGNOSIS — R064 Hyperventilation: Secondary | ICD-10-CM | POA: Diagnosis not present

## 2022-10-01 DIAGNOSIS — I10 Essential (primary) hypertension: Secondary | ICD-10-CM | POA: Diagnosis not present

## 2022-11-12 DIAGNOSIS — Z5986 Financial insecurity: Secondary | ICD-10-CM | POA: Diagnosis not present

## 2022-11-12 DIAGNOSIS — F419 Anxiety disorder, unspecified: Secondary | ICD-10-CM | POA: Diagnosis not present

## 2022-11-12 DIAGNOSIS — R112 Nausea with vomiting, unspecified: Secondary | ICD-10-CM | POA: Diagnosis not present

## 2022-11-25 ENCOUNTER — Telehealth: Payer: Self-pay

## 2022-11-25 NOTE — Telephone Encounter (Signed)
..   Medicaid Managed Care   Unsuccessful Outreach Note  11/25/2022 Name: Meredith Lawrence MRN: [id number] DOB: 1961-05-02  Referred by: Patient, No Pcp Per Reason for referral : Appointment   An unsuccessful telephone outreach was attempted today. The patient was referred to the case management team for assistance with care management and care coordination.   Follow Up Plan: The care management team will reach out to the patient again over the next 7 days.   Weston Settle Care Guide  [facility] Managed  Care Guide [facility]  [phone]

## 2022-11-28 ENCOUNTER — Telehealth: Payer: Self-pay

## 2022-11-28 NOTE — Telephone Encounter (Signed)
..   Medicaid Managed Care   Unsuccessful Outreach Note  11/28/2022 Name: Meredith Lawrence MRN: [id number] DOB: March 14, 1961  Referred by: Patient, No Pcp Per Reason for referral : Appointment (I made a 2nd attempt to reach the patient today. She did not answer and she did not have voicemail so I was not able to leave a message.)   A second unsuccessful telephone outreach was attempted today. The patient was referred to the case management team for assistance with care management and care coordination.   Follow Up Plan: The care management team will reach out to the patient again over the next 7 days.   Weston Settle Care Guide  [facility] Managed  [facility]  [phone]

## 2022-12-01 ENCOUNTER — Telehealth: Payer: Self-pay

## 2022-12-01 NOTE — Telephone Encounter (Signed)
..  Patient declines further follow up and engagement by the Managed Medicaid Team. Appropriate care team members and provider have been notified via electronic communication. The Managed Medicaid Team is available to follow up with the patient after provider conversation with the patient regarding recommendation for engagement and subsequent re-referral to the Managed Medicaid Team.     I spoke to Ms.Hargadon today regarding a referral from her insurance. Patent was very belligerent and screamed at me while I was on the phone with her. She disconnected the call.   Leea Rambeau Care Guide  [facility]  [phone]

## 2022-12-11 ENCOUNTER — Ambulatory Visit: Payer: No Typology Code available for payment source | Admitting: Family Medicine

## 2022-12-12 ENCOUNTER — Telehealth: Payer: Self-pay | Admitting: General Practice

## 2022-12-17 ENCOUNTER — Ambulatory Visit: Payer: No Typology Code available for payment source | Admitting: Family Medicine

## 2022-12-18 NOTE — Telephone Encounter (Signed)
error 

## 2022-12-31 ENCOUNTER — Emergency Department (HOSPITAL_COMMUNITY)
Admission: EM | Admit: 2022-12-31 | Discharge: 2022-12-31 | Discharge status: Against Medical Advice | Payer: Medicaid Other

## 2022-12-31 NOTE — ED Notes (Signed)
Registration reported that pt left 

## 2023-01-06 ENCOUNTER — Ambulatory Visit (HOSPITAL_COMMUNITY)
Admission: EM | Admit: 2023-01-06 | Discharge: 2023-01-06 | Disposition: A | Discharge status: Home / Self Care | Payer: Medicaid Other
